# Patient Record
Sex: Female | Born: 1970 | Race: Black or African American | Hispanic: No | Marital: Married
Health system: Northeastern US, Academic
[De-identification: ages and names within clinical notes are randomized; demographics above are authoritative.]

## PROBLEM LIST (undated history)

## (undated) DIAGNOSIS — E119 Type 2 diabetes mellitus without complications: Secondary | ICD-10-CM

## (undated) HISTORY — PX: NO PAST SURGERIES: SHX2092

---

## 2003-01-07 ENCOUNTER — Ambulatory Visit (HOSPITAL_COMMUNITY): Admission: RE | Admit: 2003-01-07 | Discharge: 2003-01-07 | Payer: Self-pay | Admitting: *Deleted

## 2003-04-10 ENCOUNTER — Ambulatory Visit (HOSPITAL_COMMUNITY): Admission: RE | Admit: 2003-04-10 | Discharge: 2003-04-10 | Payer: Self-pay | Admitting: *Deleted

## 2003-04-15 ENCOUNTER — Encounter: Admission: RE | Admit: 2003-04-15 | Discharge: 2003-04-15 | Payer: Self-pay | Admitting: *Deleted

## 2003-04-22 ENCOUNTER — Encounter: Admission: RE | Admit: 2003-04-22 | Discharge: 2003-04-22 | Payer: Self-pay | Admitting: *Deleted

## 2003-04-29 ENCOUNTER — Encounter: Admission: RE | Admit: 2003-04-29 | Discharge: 2003-04-29 | Payer: Self-pay | Admitting: *Deleted

## 2003-05-13 ENCOUNTER — Encounter: Admission: RE | Admit: 2003-05-13 | Discharge: 2003-05-13 | Payer: Self-pay | Admitting: *Deleted

## 2003-05-20 ENCOUNTER — Encounter: Admission: RE | Admit: 2003-05-20 | Discharge: 2003-05-20 | Payer: Self-pay | Admitting: *Deleted

## 2003-05-27 ENCOUNTER — Encounter: Admission: RE | Admit: 2003-05-27 | Discharge: 2003-05-27 | Payer: Self-pay | Admitting: *Deleted

## 2003-06-03 ENCOUNTER — Encounter: Admission: RE | Admit: 2003-06-03 | Discharge: 2003-06-03 | Payer: Self-pay | Admitting: Family Medicine

## 2003-06-07 ENCOUNTER — Inpatient Hospital Stay (HOSPITAL_COMMUNITY): Admission: AD | Admit: 2003-06-07 | Discharge: 2003-06-10 | Payer: Self-pay | Admitting: *Deleted

## 2003-10-12 ENCOUNTER — Encounter: Admission: RE | Admit: 2003-10-12 | Discharge: 2004-01-10 | Payer: Self-pay | Admitting: *Deleted

## 2003-11-10 ENCOUNTER — Emergency Department (HOSPITAL_COMMUNITY): Admission: AD | Admit: 2003-11-10 | Discharge: 2003-11-10 | Payer: Self-pay | Admitting: Family Medicine

## 2007-04-22 ENCOUNTER — Encounter: Payer: Self-pay | Admitting: Obstetrics & Gynecology

## 2007-04-22 ENCOUNTER — Inpatient Hospital Stay (HOSPITAL_COMMUNITY): Admission: AD | Admit: 2007-04-22 | Discharge: 2007-04-24 | Payer: Self-pay | Admitting: Obstetrics & Gynecology

## 2008-08-03 ENCOUNTER — Encounter: Admission: RE | Admit: 2008-08-03 | Discharge: 2008-08-03 | Payer: Self-pay | Admitting: Internal Medicine

## 2009-08-10 ENCOUNTER — Ambulatory Visit (HOSPITAL_COMMUNITY): Admission: RE | Admit: 2009-08-10 | Discharge: 2009-08-10 | Payer: Self-pay | Admitting: Obstetrics

## 2009-09-15 ENCOUNTER — Emergency Department (HOSPITAL_COMMUNITY): Admission: EM | Admit: 2009-09-15 | Discharge: 2009-09-15 | Payer: Self-pay | Admitting: Emergency Medicine

## 2009-09-19 ENCOUNTER — Inpatient Hospital Stay (HOSPITAL_COMMUNITY): Admission: AD | Admit: 2009-09-19 | Discharge: 2009-09-19 | Payer: Self-pay | Admitting: Obstetrics

## 2009-09-19 ENCOUNTER — Emergency Department (HOSPITAL_COMMUNITY): Admission: EM | Admit: 2009-09-19 | Discharge: 2009-09-19 | Payer: Self-pay | Admitting: Emergency Medicine

## 2009-11-27 ENCOUNTER — Inpatient Hospital Stay (HOSPITAL_COMMUNITY): Admission: AD | Admit: 2009-11-27 | Discharge: 2009-11-27 | Payer: Self-pay | Admitting: Obstetrics

## 2009-12-26 ENCOUNTER — Inpatient Hospital Stay (HOSPITAL_COMMUNITY): Admission: AD | Admit: 2009-12-26 | Discharge: 2009-12-26 | Payer: Self-pay | Admitting: Obstetrics & Gynecology

## 2010-02-17 ENCOUNTER — Inpatient Hospital Stay (HOSPITAL_COMMUNITY): Admission: AD | Admit: 2010-02-17 | Discharge: 2010-02-17 | Payer: Self-pay | Admitting: Obstetrics

## 2010-02-25 ENCOUNTER — Ambulatory Visit (HOSPITAL_COMMUNITY): Admission: RE | Admit: 2010-02-25 | Discharge: 2010-02-25 | Payer: Self-pay | Admitting: Obstetrics

## 2010-07-20 ENCOUNTER — Emergency Department (HOSPITAL_COMMUNITY): Admission: EM | Admit: 2010-07-20 | Discharge: 2010-07-20 | Payer: Self-pay | Admitting: Emergency Medicine

## 2010-10-12 ENCOUNTER — Inpatient Hospital Stay (HOSPITAL_COMMUNITY)
Admission: AD | Admit: 2010-10-12 | Discharge: 2010-10-12 | Payer: Self-pay | Source: Home / Self Care | Admitting: Gynecology

## 2010-11-05 ENCOUNTER — Emergency Department (HOSPITAL_COMMUNITY)
Admission: EM | Admit: 2010-11-05 | Discharge: 2010-11-05 | Payer: Self-pay | Source: Home / Self Care | Admitting: Family Medicine

## 2010-11-17 ENCOUNTER — Encounter
Admission: RE | Admit: 2010-11-17 | Discharge: 2010-11-17 | Payer: Self-pay | Source: Home / Self Care | Attending: Family Medicine | Admitting: Family Medicine

## 2010-11-20 ENCOUNTER — Encounter: Admission: RE | Admit: 2010-11-20 | Payer: Self-pay | Source: Home / Self Care | Admitting: Family Medicine

## 2010-11-30 ENCOUNTER — Encounter: Admit: 2010-11-30 | Payer: Self-pay | Admitting: Family Medicine

## 2010-12-11 ENCOUNTER — Encounter: Payer: Self-pay | Admitting: Obstetrics

## 2010-12-12 ENCOUNTER — Encounter: Payer: Self-pay | Admitting: Internal Medicine

## 2011-01-02 ENCOUNTER — Inpatient Hospital Stay (INDEPENDENT_AMBULATORY_CARE_PROVIDER_SITE_OTHER)
Admission: RE | Admit: 2011-01-02 | Discharge: 2011-01-02 | Disposition: A | Discharge status: Home / Self Care | Payer: Self-pay | Source: Ambulatory Visit | Attending: Emergency Medicine | Admitting: Emergency Medicine

## 2011-01-02 DIAGNOSIS — K2970 Gastritis, unspecified, without bleeding: Secondary | ICD-10-CM

## 2011-01-02 DIAGNOSIS — K2990 Gastroduodenitis, unspecified, without bleeding: Secondary | ICD-10-CM

## 2011-01-02 LAB — CBC
MCV: 87.8 fL (ref 78.0–100.0)
RBC: 3.85 MIL/uL — ABNORMAL LOW (ref 3.87–5.11)
WBC: 5.4 10*3/uL (ref 4.0–10.5)

## 2011-01-02 LAB — COMPREHENSIVE METABOLIC PANEL
Alkaline Phosphatase: 38 U/L — ABNORMAL LOW (ref 39–117)
BUN: 4 mg/dL — ABNORMAL LOW (ref 6–23)
Chloride: 105 mEq/L (ref 96–112)
Creatinine, Ser: 0.41 mg/dL (ref 0.4–1.2)
Glucose, Bld: 141 mg/dL — ABNORMAL HIGH (ref 70–99)
Potassium: 3.5 mEq/L (ref 3.5–5.1)

## 2011-01-02 LAB — DIFFERENTIAL
Basophils Relative: 0 % (ref 0–1)
Eosinophils Absolute: 0.1 10*3/uL (ref 0.0–0.7)
Monocytes Absolute: 0.2 10*3/uL (ref 0.1–1.0)
Monocytes Relative: 4 % (ref 3–12)

## 2011-01-02 LAB — LIPASE, BLOOD: Lipase: 19 U/L (ref 11–59)

## 2011-01-02 LAB — POCT H PYLORI SCREEN: H. PYLORI SCREEN, POC: NEGATIVE

## 2011-01-31 LAB — URINALYSIS, ROUTINE W REFLEX MICROSCOPIC
Glucose, UA: NEGATIVE mg/dL
Ketones, ur: NEGATIVE mg/dL
Specific Gravity, Urine: >1.03 — ABNORMAL HIGH (ref 1.005–1.030)
Urobilinogen, UA: 0.2 mg/dL (ref 0.0–1.0)
pH: 5.5 (ref 5.0–8.0)

## 2011-01-31 LAB — ABO/RH: ABO/RH(D): A POS

## 2011-01-31 LAB — URINE MICROSCOPIC-ADD ON

## 2011-02-03 LAB — CBC
Hemoglobin: 11.5 g/dL — ABNORMAL LOW (ref 12.0–15.0)
MCHC: 34.3 g/dL (ref 30.0–36.0)
RBC: 3.76 MIL/uL — ABNORMAL LOW (ref 3.87–5.11)
RDW: 13.2 % (ref 11.5–15.5)
WBC: 4.7 10*3/uL (ref 4.0–10.5)

## 2011-02-03 LAB — DIFFERENTIAL
Basophils Absolute: 0 10*3/uL (ref 0.0–0.1)
Basophils Relative: 0 % (ref 0–1)
Monocytes Absolute: 0.4 10*3/uL (ref 0.1–1.0)
Neutro Abs: 1.7 10*3/uL (ref 1.7–7.7)
Neutrophils Relative %: 35 % — ABNORMAL LOW (ref 43–77)

## 2011-02-03 LAB — POCT URINALYSIS DIPSTICK: Nitrite: NEGATIVE

## 2011-02-03 LAB — POCT I-STAT, CHEM 8
Glucose, Bld: 106 mg/dL — ABNORMAL HIGH (ref 70–99)
HCT: 38 % (ref 36.0–46.0)
Hemoglobin: 12.9 g/dL (ref 12.0–15.0)
Potassium: 3.7 mEq/L (ref 3.5–5.1)
Sodium: 141 mEq/L (ref 135–145)

## 2011-02-03 LAB — POCT H PYLORI SCREEN: H. PYLORI SCREEN, POC: NEGATIVE

## 2011-02-05 LAB — CBC
HCT: 35 % — ABNORMAL LOW (ref 36.0–46.0)
Hemoglobin: 11.7 g/dL — ABNORMAL LOW (ref 12.0–15.0)
MCHC: 33.5 g/dL (ref 30.0–36.0)
RDW: 14.4 % (ref 11.5–15.5)

## 2011-02-05 LAB — GC/CHLAMYDIA PROBE AMP, GENITAL: Chlamydia, DNA Probe: NEGATIVE

## 2011-02-05 LAB — WET PREP, GENITAL: Clue Cells Wet Prep HPF POC: NONE SEEN

## 2011-02-12 LAB — POCT PREGNANCY, URINE: Preg Test, Ur: NEGATIVE

## 2011-02-12 LAB — URINALYSIS, ROUTINE W REFLEX MICROSCOPIC
Bilirubin Urine: NEGATIVE
Glucose, UA: NEGATIVE mg/dL
Ketones, ur: NEGATIVE mg/dL
pH: 6 (ref 5.0–8.0)

## 2011-02-12 LAB — URINE MICROSCOPIC-ADD ON

## 2011-02-12 LAB — WET PREP, GENITAL: Yeast Wet Prep HPF POC: NONE SEEN

## 2011-02-12 LAB — GC/CHLAMYDIA PROBE AMP, GENITAL
Chlamydia, DNA Probe: NEGATIVE
GC Probe Amp, Genital: NEGATIVE

## 2011-02-23 LAB — CBC
HCT: 30.8 % — ABNORMAL LOW (ref 36.0–46.0)
HCT: 33.2 % — ABNORMAL LOW (ref 36.0–46.0)
Hemoglobin: 11.1 g/dL — ABNORMAL LOW (ref 12.0–15.0)
MCV: 91.7 fL (ref 78.0–100.0)
MCV: 90.8 fL (ref 78.0–100.0)
MCV: 91.5 fL (ref 78.0–100.0)
Platelets: 214 10*3/uL (ref 150–400)
Platelets: 233 10*3/uL (ref 150–400)
RBC: 3.65 MIL/uL — ABNORMAL LOW (ref 3.87–5.11)
RDW: 14.4 % (ref 11.5–15.5)
RDW: 14.5 % (ref 11.5–15.5)
WBC: 4.2 10*3/uL (ref 4.0–10.5)
WBC: 8.8 10*3/uL (ref 4.0–10.5)

## 2011-02-23 LAB — POCT PREGNANCY, URINE: Preg Test, Ur: NEGATIVE

## 2011-02-23 LAB — POCT URINALYSIS DIP (DEVICE)
Glucose, UA: NEGATIVE mg/dL
Nitrite: NEGATIVE
Specific Gravity, Urine: 1.015 (ref 1.005–1.030)
Urobilinogen, UA: 0.2 mg/dL (ref 0.0–1.0)
pH: 5 (ref 5.0–8.0)

## 2011-02-23 LAB — URINALYSIS, ROUTINE W REFLEX MICROSCOPIC
Glucose, UA: NEGATIVE mg/dL
Hgb urine dipstick: NEGATIVE
Specific Gravity, Urine: 1.034 — ABNORMAL HIGH (ref 1.005–1.030)
pH: 6 (ref 5.0–8.0)

## 2011-02-23 LAB — DIFFERENTIAL
Basophils Absolute: 0 10*3/uL (ref 0.0–0.1)
Basophils Absolute: 0 10*3/uL (ref 0.0–0.1)
Eosinophils Absolute: 0.3 10*3/uL (ref 0.0–0.7)
Eosinophils Absolute: 0 10*3/uL (ref 0.0–0.7)
Eosinophils Relative: 10 % — ABNORMAL HIGH (ref 0–5)
Eosinophils Relative: 0 % (ref 0–5)
Eosinophils Relative: 5 % (ref 0–5)
Lymphocytes Relative: 10 % — ABNORMAL LOW (ref 12–46)
Lymphocytes Relative: 41 % (ref 12–46)
Lymphs Abs: 0.8 10*3/uL (ref 0.7–4.0)
Monocytes Absolute: 0.1 10*3/uL (ref 0.1–1.0)
Monocytes Relative: 1 % — ABNORMAL LOW (ref 3–12)
Monocytes Relative: 3 % (ref 3–12)

## 2011-02-23 LAB — COMPREHENSIVE METABOLIC PANEL
AST: 21 U/L (ref 0–37)
Albumin: 3.8 g/dL (ref 3.5–5.2)
Chloride: 105 mEq/L (ref 96–112)
Creatinine, Ser: 0.58 mg/dL (ref 0.4–1.2)
GFR calc Af Amer: >60 mL/min (ref >60)
Potassium: 3.7 mEq/L (ref 3.5–5.1)
Total Bilirubin: 0.6 mg/dL (ref 0.3–1.2)
Total Protein: 7.9 g/dL (ref 6.0–8.3)

## 2011-03-22 ENCOUNTER — Encounter: Payer: Medicaid Other | Attending: Obstetrics and Gynecology

## 2011-03-22 DIAGNOSIS — Z713 Dietary counseling and surveillance: Secondary | ICD-10-CM | POA: Insufficient documentation

## 2011-03-22 DIAGNOSIS — O99810 Abnormal glucose complicating pregnancy: Secondary | ICD-10-CM | POA: Insufficient documentation

## 2011-04-04 NOTE — H&P (Signed)
Kristen Ford, Kristen Ford               ACCOUNT NO.:  [id number]   MEDICAL RECORD NO.:  [id number]          PATIENT TYPE:  INP   LOCATION:  9163                          FACILITY:  [facility]   PHYSICIAN:  Roseanna Rainbow, M.D.DATE OF BIRTH:  [birth year]   DATE OF ADMISSION:  04/22/2007  DATE OF DISCHARGE:                              HISTORY & PHYSICAL   CHIEF COMPLAINT:  The patient is a 40 year old para 1 with an estimated  date of confinement of April 25, 2007, with an intrauterine pregnancy at  39+ weeks complaining of uterine contractions.   HISTORY OF PRESENT ILLNESS:  Please see the above.   ALLERGIES:  No known drug allergies.   MEDICATIONS:  Please see the medication reconciliation form.   OBSTETRICAL RISK FACTORS:  1. Gestational diabetes, diet controlled.  2. Advanced maternal age.   PRENATAL SCREENINGS:  Hemoglobin 10.9; hematocrit 33.1; platelets  242,000.  RPR nonreactive.  Hemoglobin electrophoresis normal.  Rubella  immune.  Blood type is A positive, antibody screen negative.  Pap smear  negative.  GBS positive.  Chlamydia and gonorrhea DNA probes negative.  One-hour GCT 178.   PAST GYNECOLOGICAL HISTORY:  Noncontributory.   PAST MEDICAL HISTORY:  No significant history of medical diseases.   PAST SURGICAL HISTORY:  She denies.   SOCIAL HISTORY:  Unemployed, married.  Does not give any significant  history of alcohol usage.  Has no significant smoking history.  Denies  illicit drug use.   FAMILY HISTORY:  Adult-onset diabetes.   PAST OBSTETRICAL HISTORY:  In July 2004 she was delivered of a live-born  female, full-term, 7 pounds, vaginal delivery, complicated by  gestational diabetes.   PHYSICAL EXAMINATION:  Vital signs stable, afebrile.  Fetal heart  tracing reassuring.  Tocodynamometer:  Uterine contractions every 5-10  minutes.  Sterile vaginal exam per the RN:  The cervix is 5 cm dilated.   ASSESSMENT:  1. Intrauterine pregnancy at term.  2.  Gestational diabetes, diet controlled.  3. Late latent versus early active labor.  4. Fetal heart tracing consistent with fetal well-being.  5. Group B streptococcus positive.   PLAN:  1. Admission.  2. Check random CBG now.  3. Augmentation of labor with low-dose Pitocin per protocol.  4. GBS prophylaxis with penicillin.      Roseanna Rainbow, M.D.  Electronically Signed     LAJ/MEDQ  D:  04/22/2007  T:  04/22/2007  Job:  [id number]

## 2011-04-07 NOTE — Op Note (Signed)
   NAME:  IISHA, SOYARS                         ACCOUNT NO.:  [id number]   MEDICAL RECORD NO.:  [id number]                   PATIENT TYPE:  INP   LOCATION:  9108                                 FACILITY:  [facility]   PHYSICIAN:  Phil D. Okey Dupre, M.D.                  DATE OF BIRTH:  04-04-1971   DATE OF PROCEDURE:  06/08/2003  DATE OF DISCHARGE:                                 OPERATIVE REPORT   PROCEDURE:  Vacuum-assisted vaginal delivery with second degree perineal  laceration.   PREOPERATIVE DIAGNOSIS:  1. Term pregnancy with extended second stage of labor.  2. Maternal exhaustion.   POSTOPERATIVE DIAGNOSES:  1. Term pregnancy with extended second stage of labor.  2. Maternal exhaustion.   OPERATOR:  Phil D. Okey Dupre, M.D.   ASSISTANT:  Lawerance Sabal, M.D.   ANESTHESIA:  Epidural.   ESTIMATED BLOOD LOSS:  500 mL.   OPERATIVE FINDINGS:  After an extended second stage of labor, the baby was  mainly in an ROP presentation, the patient finally did convert to ROA  presentation at a +3 station and was completely exhausted, _______.  The  vacuum cup was placed over the baby's occiput and with the patient pushing,  the baby was easily delivered with two contractions.  It was a female with  an Apgar of 8 and 9.  The cord was doubly clamped, divided, the baby taken  to the isolette and suctioned.  The placenta was spontaneously removed,  examined and complete.  There was a second degree perineal laceration, which  was repaired with a 3-0 Vicryl suture.  Postpartum condition   Dictation ended at this point.                                               Phil D. Okey Dupre, M.D.    PDR/MEDQ  D:  06/09/2003  T:  06/09/2003  Job:  [id number]

## 2011-05-10 ENCOUNTER — Inpatient Hospital Stay (HOSPITAL_COMMUNITY)
Admission: AD | Admit: 2011-05-10 | Discharge: 2011-05-13 | DRG: 775 | Disposition: A | Discharge status: Home / Self Care | Payer: Medicaid Other | Source: Ambulatory Visit | Attending: Obstetrics and Gynecology | Admitting: Obstetrics and Gynecology

## 2011-05-10 DIAGNOSIS — O99814 Abnormal glucose complicating childbirth: Principal | ICD-10-CM | POA: Diagnosis present

## 2011-05-10 DIAGNOSIS — O09529 Supervision of elderly multigravida, unspecified trimester: Secondary | ICD-10-CM | POA: Diagnosis present

## 2011-05-10 LAB — CBC
HCT: 33.7 % — ABNORMAL LOW (ref 36.0–46.0)
MCH: 30.7 pg (ref 26.0–34.0)
MCV: 86.2 fL (ref 78.0–100.0)
Platelets: 142 10*3/uL — ABNORMAL LOW (ref 150–400)
RBC: 3.91 MIL/uL (ref 3.87–5.11)
RDW: 13.3 % (ref 11.5–15.5)
WBC: 4.3 10*3/uL (ref 4.0–10.5)

## 2011-05-11 ENCOUNTER — Other Ambulatory Visit: Payer: Self-pay | Admitting: Obstetrics and Gynecology

## 2011-05-11 LAB — GLUCOSE, CAPILLARY: Glucose-Capillary: 121 mg/dL — ABNORMAL HIGH (ref 70–99)

## 2011-05-12 LAB — CBC
Hemoglobin: 10.3 g/dL — ABNORMAL LOW (ref 12.0–15.0)
MCH: 30.2 pg (ref 26.0–34.0)
MCHC: 33.9 g/dL (ref 30.0–36.0)
Platelets: 128 10*3/uL — ABNORMAL LOW (ref 150–400)
RBC: 3.41 MIL/uL — ABNORMAL LOW (ref 3.87–5.11)

## 2011-05-12 LAB — STREP B DNA PROBE

## 2011-05-12 LAB — GLUCOSE, CAPILLARY: Glucose-Capillary: 145 mg/dL — ABNORMAL HIGH (ref 70–99)

## 2011-06-06 ENCOUNTER — Inpatient Hospital Stay (HOSPITAL_COMMUNITY): Admission: AD | Admit: 2011-06-06 | Payer: Self-pay | Source: Home / Self Care | Admitting: Obstetrics and Gynecology

## 2011-08-25 ENCOUNTER — Emergency Department (HOSPITAL_COMMUNITY)
Admission: EM | Admit: 2011-08-25 | Discharge: 2011-08-25 | Disposition: A | Discharge status: Home / Self Care | Payer: No Typology Code available for payment source | Attending: Emergency Medicine | Admitting: Emergency Medicine

## 2011-08-25 ENCOUNTER — Emergency Department (HOSPITAL_COMMUNITY): Payer: No Typology Code available for payment source

## 2011-08-25 DIAGNOSIS — T1490XA Injury, unspecified, initial encounter: Secondary | ICD-10-CM | POA: Insufficient documentation

## 2011-08-25 DIAGNOSIS — R279 Unspecified lack of coordination: Secondary | ICD-10-CM | POA: Insufficient documentation

## 2011-08-25 DIAGNOSIS — Y92410 Unspecified street and highway as the place of occurrence of the external cause: Secondary | ICD-10-CM | POA: Insufficient documentation

## 2011-08-25 DIAGNOSIS — R51 Headache: Secondary | ICD-10-CM | POA: Insufficient documentation

## 2011-08-25 DIAGNOSIS — S060X0A Concussion without loss of consciousness, initial encounter: Secondary | ICD-10-CM | POA: Insufficient documentation

## 2011-08-27 ENCOUNTER — Inpatient Hospital Stay (INDEPENDENT_AMBULATORY_CARE_PROVIDER_SITE_OTHER)
Admission: RE | Admit: 2011-08-27 | Discharge: 2011-08-27 | Disposition: A | Discharge status: Home / Self Care | Payer: No Typology Code available for payment source | Source: Ambulatory Visit | Attending: Family Medicine | Admitting: Family Medicine

## 2011-08-27 ENCOUNTER — Ambulatory Visit (INDEPENDENT_AMBULATORY_CARE_PROVIDER_SITE_OTHER): Payer: No Typology Code available for payment source

## 2011-08-27 DIAGNOSIS — S20229A Contusion of unspecified back wall of thorax, initial encounter: Secondary | ICD-10-CM

## 2011-09-07 LAB — CBC
HCT: 28.8 — ABNORMAL LOW
HCT: 30.8 — ABNORMAL LOW
Hemoglobin: 10.5 — ABNORMAL LOW
MCHC: 33.4
MCHC: 34.1
MCV: 84.4
MCV: 86.2
Platelets: 172
Platelets: 177
RDW: 14.5 — ABNORMAL HIGH
RDW: 15.2 — ABNORMAL HIGH

## 2012-02-12 ENCOUNTER — Other Ambulatory Visit: Payer: Self-pay | Admitting: Family Medicine

## 2012-02-12 DIAGNOSIS — Z1231 Encounter for screening mammogram for malignant neoplasm of breast: Secondary | ICD-10-CM

## 2012-02-23 ENCOUNTER — Ambulatory Visit: Payer: Medicaid Other

## 2012-03-27 IMAGING — CR DG LUMBAR SPINE COMPLETE 4+V
5 series · 5 of 5 positions shown · non-contrast
Comparison: 07/20/2010, 09/15/2009

CLINICAL DATA: Trauma, back pain

LUMBAR SPINE - COMPLETE 4+ VIEW

[view not recorded (1 of 5)]
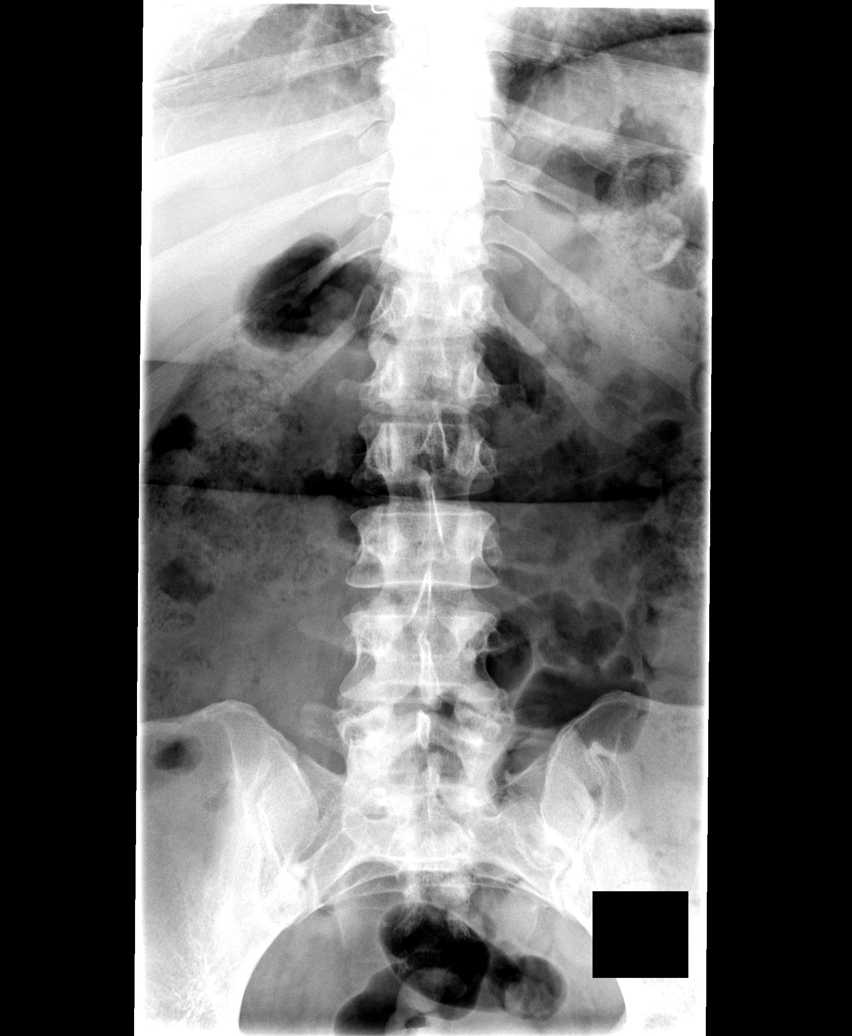

[view not recorded (2 of 5)]
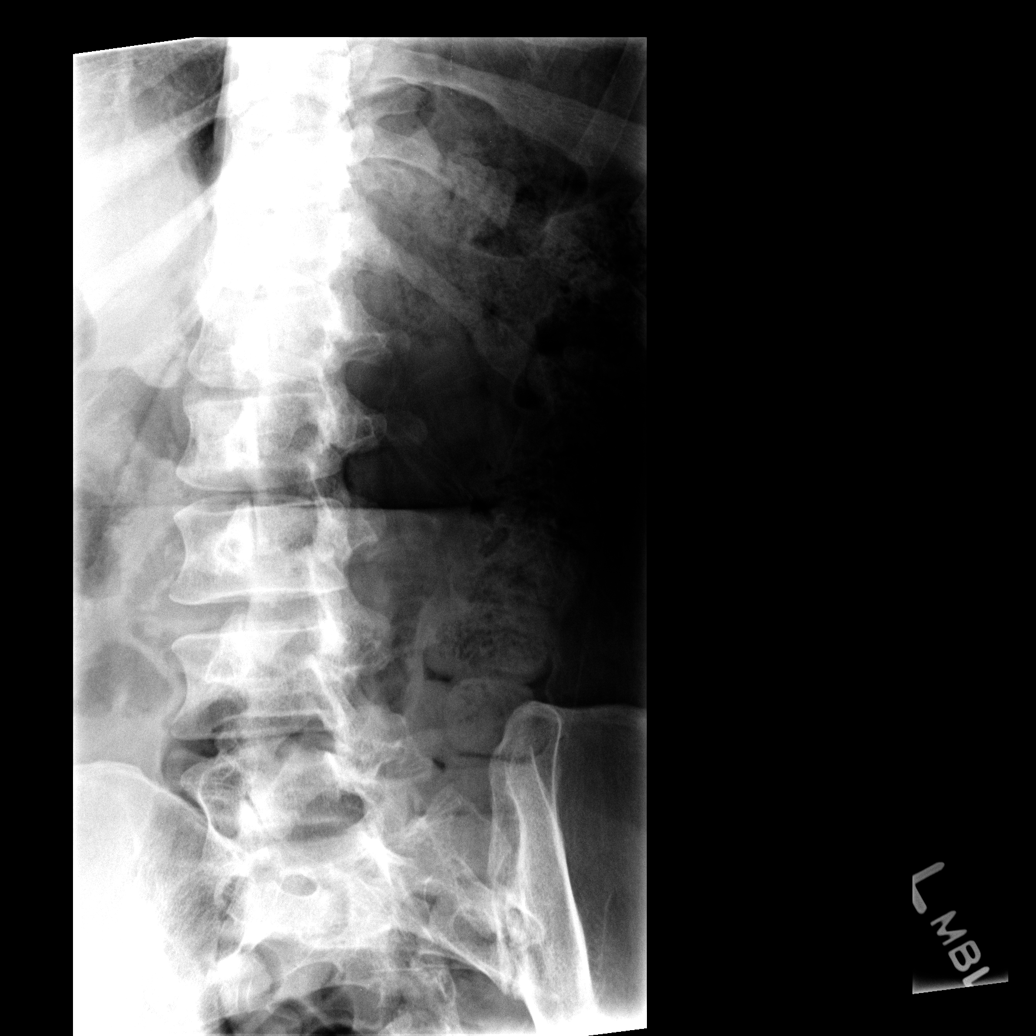

[view not recorded (3 of 5)]
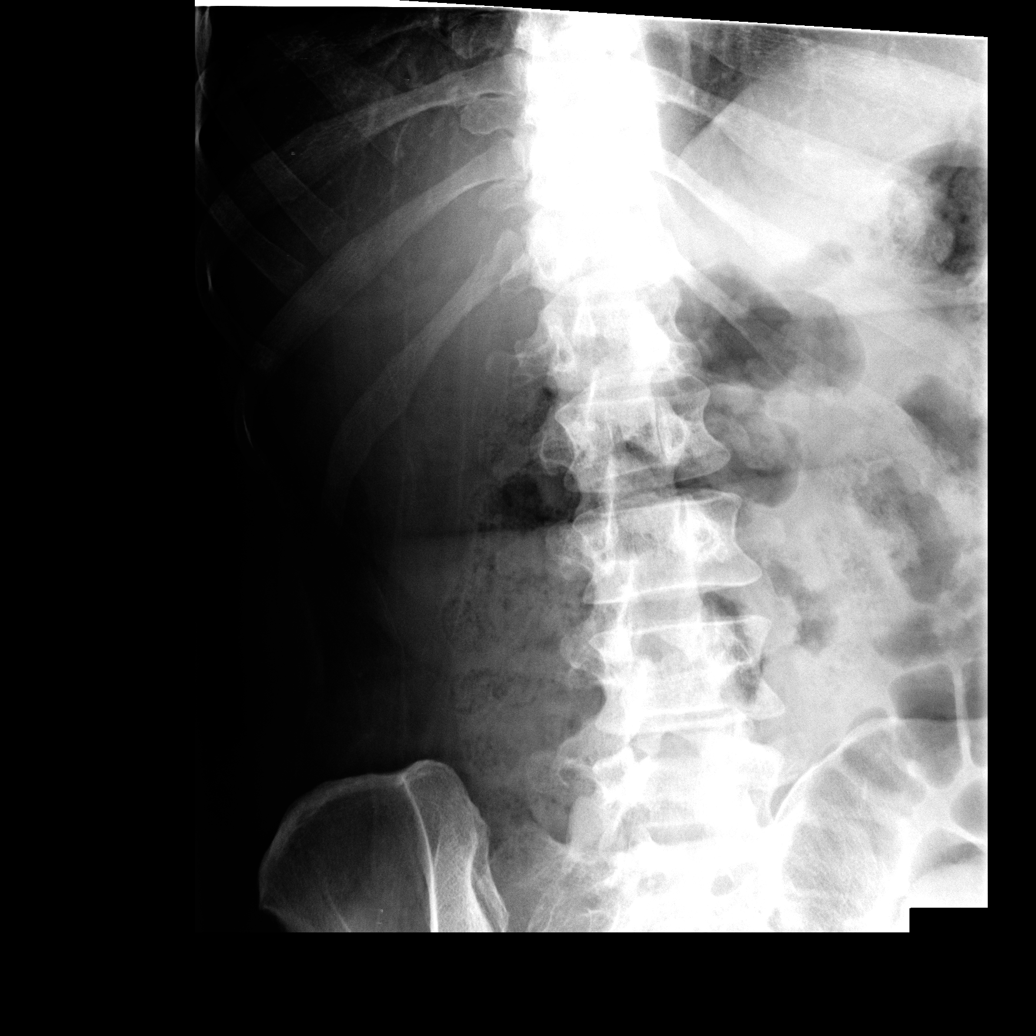

[view not recorded (4 of 5)]
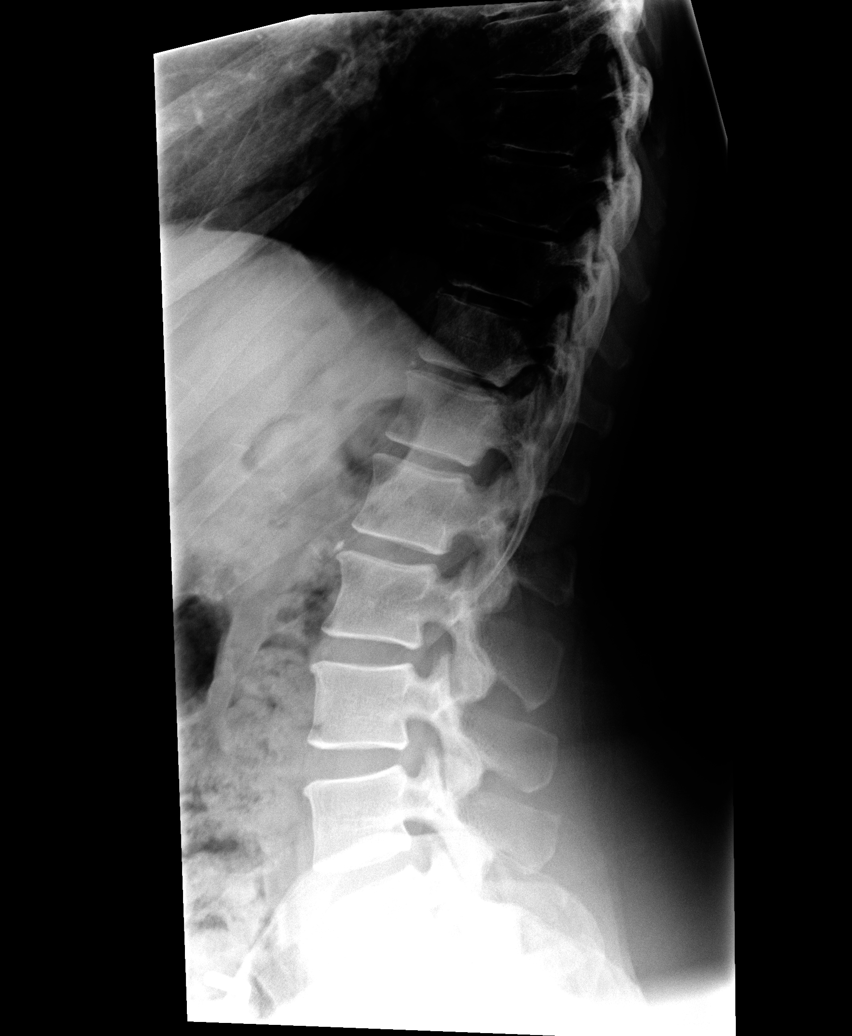

[view not recorded (5 of 5)]
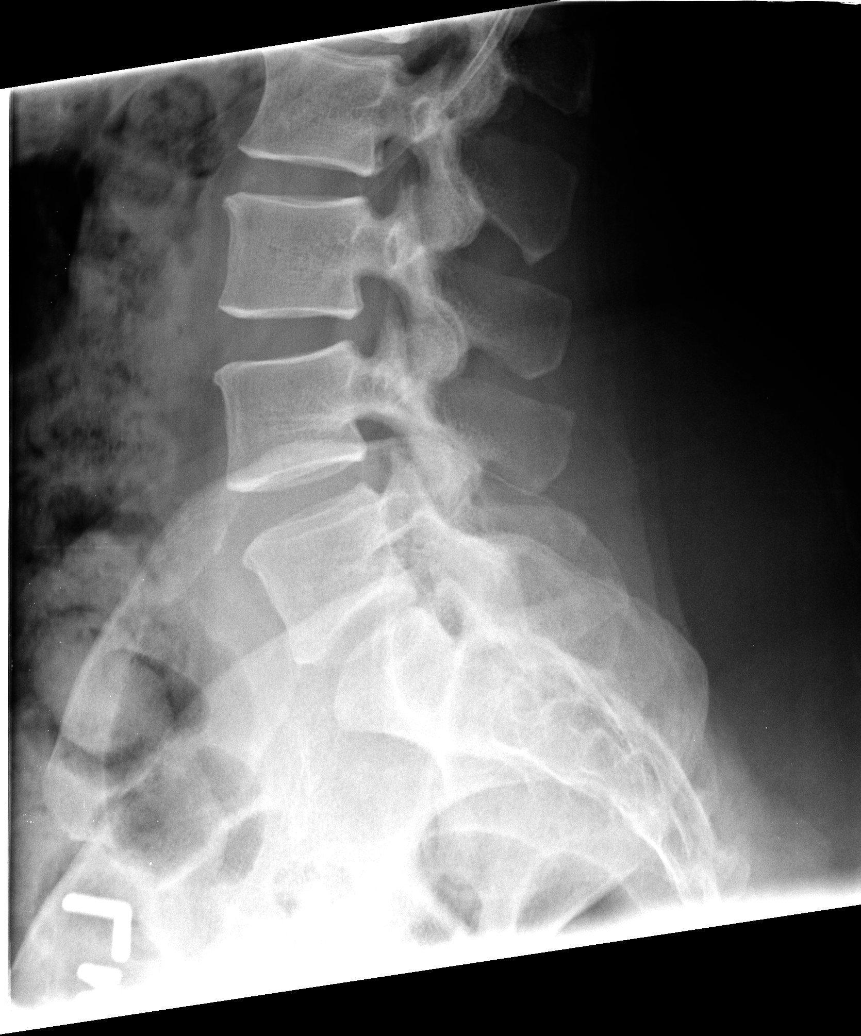

[5 of 5 positions shown; findings below may reference images not displayed]

FINDINGS: Normal alignment.  No compression fracture, wedge shaped
deformity or focal kyphosis.  Preserved vertebral body heights and
disc spaces.  No pars defects.  Normal pedicles and SI joints.
IMPRESSION: No acute osseous finding.  Stable exam.

## 2012-03-28 ENCOUNTER — Other Ambulatory Visit: Payer: Self-pay | Admitting: Obstetrics

## 2012-03-28 DIAGNOSIS — Z1231 Encounter for screening mammogram for malignant neoplasm of breast: Secondary | ICD-10-CM

## 2012-04-19 ENCOUNTER — Ambulatory Visit
Admission: RE | Admit: 2012-04-19 | Discharge: 2012-04-19 | Disposition: A | Discharge status: Home / Self Care | Payer: Medicaid Other | Source: Ambulatory Visit | Attending: Obstetrics | Admitting: Obstetrics

## 2012-04-19 DIAGNOSIS — Z1231 Encounter for screening mammogram for malignant neoplasm of breast: Secondary | ICD-10-CM

## 2012-11-10 ENCOUNTER — Encounter (HOSPITAL_COMMUNITY): Payer: Self-pay

## 2012-11-10 ENCOUNTER — Emergency Department (HOSPITAL_COMMUNITY)
Admission: EM | Admit: 2012-11-10 | Discharge: 2012-11-10 | Disposition: A | Discharge status: Home / Self Care | Payer: Medicaid Other | Source: Home / Self Care

## 2012-11-10 DIAGNOSIS — L089 Local infection of the skin and subcutaneous tissue, unspecified: Secondary | ICD-10-CM

## 2012-11-10 MED ORDER — CLINDAMYCIN HCL 300 MG PO CAPS: ORAL_CAPSULE | ORAL | Status: DC

## 2012-11-10 NOTE — ED Provider Notes (Signed)
Medical screening examination/treatment/procedure(s) were performed by non-physician practitioner and as supervising physician I was immediately available for consultation/collaboration.  Sahana Boyland, M.D.   Jhoanna Heyde C Joh Rao, MD 11/10/12 2029 

## 2012-11-10 NOTE — ED Provider Notes (Signed)
History     CSN: [id number]  Arrival date & time 11/10/12  1452   None     Chief Complaint  Patient presents with  . Otalgia    (Consider location/radiation/quality/duration/timing/severity/associated sxs/prior treatment) HPI Comments: 41 year old female presents with a small tender pustule of the left pain. It is located approximately 4 mm from the tragus. It is at the entrance of the [facility]. It is approximately 4 mm across. It is red and tender. There is no redness extends beyond the lesion and there is no erythema or involvement of the other aspects of the pena   History reviewed. No pertinent past medical history.  History reviewed. No pertinent past surgical history.  History reviewed. No pertinent family history.  History  Substance Use Topics  . Smoking status: Not on file  . Smokeless tobacco: Not on file  . Alcohol Use: Not on file    OB History    Grav Para Term Preterm Abortions TAB SAB Ect Mult Living                  Review of Systems  All other systems reviewed and are negative.    Allergies  Review of patient's allergies indicates no known allergies.  Home Medications   Current Outpatient Rx  Name  Route  Sig  Dispense  Refill  . CLINDAMYCIN HCL 300 MG PO CAPS      1 bid for 8 days   16 capsule   0     BP 114/71  Pulse 81  Temp 98.3 F (36.8 C) (Oral)  Resp 16  SpO2 98%  LMP 11/08/2012  Physical Exam  Constitutional: She is oriented to person, place, and time. She appears well-developed and well-nourished. No distress.  HENT:       Bilateral TMs are normal. there is some wax securing part of visualization of the TMs  Neck: Normal range of motion. Neck supple.  Pulmonary/Chest: Effort normal.  Lymphadenopathy:    She has no cervical adenopathy.  Neurological: She is alert and oriented to person, place, and time.  Skin: Skin is warm and dry.  Psychiatric: She has a normal mood and affect.    ED Course  INCISION AND  DRAINAGE Performed by: Phineas Real, Burlon Centrella Authorized by: Phineas Real, Radwan Cowley Consent: Verbal consent obtained. Risks and benefits: risks, benefits and alternatives were discussed Consent given by: patient Patient identity confirmed: verbally with patient Type: abscess Body area: head/neck Location details: left external ear Anesthesia: local infiltration Local anesthetic: lidocaine 2% without epinephrine Patient sedated: no Scalpel size: 11 Incision type: single straight Complexity: simple Drainage: purulent and bloody Drainage amount: scant Wound treatment: wound left open Comments: .And also was placed at the external auditory canal.   (including critical care time)  Labs Reviewed - No data to display No results found.   1. Pustule       MDM  To procedure above. Using an 11 blade approximately 2 mm puncture wound was made into the small pustule. There was slight purulence that drained. A culture was obtained. Is to apply warm compresses as often as possible for the next couple days. Clindamycin 300 twice a day for 8 days Return for any new symptoms problems or worsening         Hayden Rasmussen, NP 11/10/12 1702

## 2012-11-10 NOTE — ED Notes (Signed)
C/o pain in her left ear for 1 week; ?lesion noted external canal?

## 2012-11-13 LAB — CULTURE, ROUTINE-ABSCESS: Gram Stain: NONE SEEN

## 2013-03-24 ENCOUNTER — Other Ambulatory Visit: Payer: Self-pay | Admitting: Family Medicine

## 2013-03-24 DIAGNOSIS — Z1231 Encounter for screening mammogram for malignant neoplasm of breast: Secondary | ICD-10-CM

## 2013-04-25 ENCOUNTER — Ambulatory Visit
Admission: RE | Admit: 2013-04-25 | Discharge: 2013-04-25 | Disposition: A | Discharge status: Home / Self Care | Payer: Medicaid Other | Source: Ambulatory Visit | Attending: Family Medicine | Admitting: Family Medicine

## 2013-04-25 DIAGNOSIS — Z1231 Encounter for screening mammogram for malignant neoplasm of breast: Secondary | ICD-10-CM

## 2014-03-04 ENCOUNTER — Inpatient Hospital Stay (HOSPITAL_COMMUNITY)
Admission: AD | Admit: 2014-03-04 | Discharge: 2014-03-04 | Disposition: A | Discharge status: Home / Self Care | Payer: Medicaid Other | Source: Ambulatory Visit | Attending: Obstetrics & Gynecology | Admitting: Obstetrics & Gynecology

## 2014-03-04 ENCOUNTER — Encounter (HOSPITAL_COMMUNITY): Payer: Self-pay | Admitting: *Deleted

## 2014-03-04 DIAGNOSIS — R109 Unspecified abdominal pain: Secondary | ICD-10-CM | POA: Insufficient documentation

## 2014-03-04 DIAGNOSIS — E119 Type 2 diabetes mellitus without complications: Secondary | ICD-10-CM | POA: Insufficient documentation

## 2014-03-04 DIAGNOSIS — M549 Dorsalgia, unspecified: Secondary | ICD-10-CM | POA: Diagnosis present

## 2014-03-04 DIAGNOSIS — Z833 Family history of diabetes mellitus: Secondary | ICD-10-CM | POA: Insufficient documentation

## 2014-03-04 DIAGNOSIS — M545 Low back pain, unspecified: Secondary | ICD-10-CM | POA: Insufficient documentation

## 2014-03-04 HISTORY — DX: Type 2 diabetes mellitus without complications: E11.9

## 2014-03-04 LAB — URINALYSIS, ROUTINE W REFLEX MICROSCOPIC
Bilirubin Urine: NEGATIVE
Glucose, UA: NEGATIVE mg/dL
KETONES UR: NEGATIVE mg/dL
NITRITE: NEGATIVE
PROTEIN: NEGATIVE mg/dL
Specific Gravity, Urine: >1.03 — ABNORMAL HIGH (ref 1.005–1.030)
Urobilinogen, UA: 0.2 mg/dL (ref 0.0–1.0)
pH: 6 (ref 5.0–8.0)

## 2014-03-04 LAB — URINE MICROSCOPIC-ADD ON

## 2014-03-04 LAB — WET PREP, GENITAL
CLUE CELLS WET PREP: NONE SEEN
TRICH WET PREP: NONE SEEN
YEAST WET PREP: NONE SEEN

## 2014-03-04 LAB — POCT PREGNANCY, URINE: Preg Test, Ur: NEGATIVE

## 2014-03-04 MED ORDER — KETOROLAC TROMETHAMINE 10 MG PO TABS
10.0000 mg | ORAL_TABLET | Freq: Once | ORAL | Status: AC
  Administered 2014-03-04: 10 mg via ORAL
  Filled 2014-03-04: qty 1

## 2014-03-04 MED ORDER — IBUPROFEN 800 MG PO TABS: 800.0000 mg | ORAL_TABLET | Freq: Three times a day (TID) | ORAL | Status: AC

## 2014-03-04 MED ORDER — CYCLOBENZAPRINE HCL 10 MG PO TABS: 10.0000 mg | ORAL_TABLET | Freq: Three times a day (TID) | ORAL | Status: AC | PRN

## 2014-03-04 NOTE — MAU Note (Signed)
Back pain and pain in lower abd started a few days ago.  Hx of ovarian cyst.  Has an IUD, checked for strings and could not feel them

## 2014-03-04 NOTE — Discharge Instructions (Signed)
Back Exercises °Back exercises help treat and prevent back injuries. The goal of back exercises is to increase the strength of your abdominal and back muscles and the flexibility of your back. These exercises should be started when you no longer have back pain. Back exercises include: °· Pelvic Tilt. Lie on your back with your knees bent. Tilt your pelvis until the lower part of your back is against the floor. Hold this position 5 to 10 sec and repeat 5 to 10 times. °· Knee to Chest. Pull first 1 knee up against your chest and hold for 20 to 30 seconds, repeat this with the other knee, and then both knees. This may be done with the other leg straight or bent, whichever feels better. °· Sit-Ups or Curl-Ups. Bend your knees 90 degrees. Start with tilting your pelvis, and do a partial, slow sit-up, lifting your trunk only 30 to 45 degrees off the floor. Take at least 2 to 3 seconds for each sit-up. Do not do sit-ups with your knees out straight. If partial sit-ups are difficult, simply do the above but with only tightening your abdominal muscles and holding it as directed. °· Hip-Lift. Lie on your back with your knees flexed 90 degrees. Push down with your feet and shoulders as you raise your hips a couple inches off the floor; hold for 10 seconds, repeat 5 to 10 times. °· Back arches. Lie on your stomach, propping yourself up on bent elbows. Slowly press on your hands, causing an arch in your low back. Repeat 3 to 5 times. Any initial stiffness and discomfort should lessen with repetition over time. °· Shoulder-Lifts. Lie face down with arms beside your body. Keep hips and torso pressed to floor as you slowly lift your head and shoulders off the floor. °Do not overdo your exercises, especially in the beginning. Exercises may cause you some mild back discomfort which lasts for a few minutes; however, if the pain is more severe, or lasts for more than 15 minutes, do not continue exercises until you see your caregiver.  Improvement with exercise therapy for back problems is slow.  °See your caregivers for assistance with developing a proper back exercise program. °Document Released: 12/14/2004 Document Revised: 01/29/2012 Document Reviewed: 09/07/2011 °[employer]® Patient Information ©2014 [employer]. ° °Back Pain, Adult °Low back pain is very common. About 1 in 5 people have back pain. The cause of low back pain is rarely dangerous. The pain often gets better over time. About half of people with a sudden onset of back pain feel better in just 2 weeks. About 8 in 10 people feel better by 6 weeks.  °CAUSES °Some common causes of back pain include: °· Strain of the muscles or ligaments supporting the spine. °· Wear and tear (degeneration) of the spinal discs. °· Arthritis. °· Direct injury to the back. °DIAGNOSIS °Most of the time, the direct cause of low back pain is not known. However, back pain can be treated effectively even when the exact cause of the pain is unknown. Answering your caregiver's questions about your overall health and symptoms is one of the most accurate ways to make sure the cause of your pain is not dangerous. If your caregiver needs more information, he or she may order lab work or imaging tests (X-rays or MRIs). However, even if imaging tests show changes in your back, this usually does not require surgery. °HOME CARE INSTRUCTIONS °For many people, back pain returns. Since low back pain is rarely dangerous, it is often a condition that people   can learn to manage on their own.  °· Remain active. It is stressful on the back to sit or stand in one place. Do not sit, drive, or stand in one place for more than 30 minutes at a time. Take short walks on level surfaces as soon as pain allows. Try to increase the length of time you walk each day. °· Do not stay in bed. Resting more than 1 or 2 days can delay your recovery. °· Do not avoid exercise or work. Your body is made to move. It is not dangerous to be active,  even though your back may hurt. Your back will likely heal faster if you return to being active before your pain is gone. °· Pay attention to your body when you  bend and lift. Many people have less discomfort when lifting if they bend their knees, keep the load close to their bodies, and avoid twisting. Often, the most comfortable positions are those that put less stress on your recovering back. °· Find a comfortable position to sleep. Use a firm mattress and lie on your side with your knees slightly bent. If you lie on your back, put a pillow under your knees. °· Only take over-the-counter or prescription medicines as directed by your caregiver. Over-the-counter medicines to reduce pain and inflammation are often the most helpful. Your caregiver may prescribe muscle relaxant drugs. These medicines help dull your pain so you can more quickly return to your normal activities and healthy exercise. °· Put ice on the injured area. °· Put ice in a plastic bag. °· Place a towel between your skin and the bag. °· Leave the ice on for 15-20 minutes, 03-04 times a day for the first 2 to 3 days. After that, ice and heat may be alternated to reduce pain and spasms. °· Ask your caregiver about trying back exercises and gentle massage. This may be of some benefit. °· Avoid feeling anxious or stressed. Stress increases muscle tension and can worsen back pain. It is important to recognize when you are anxious or stressed and learn ways to manage it. Exercise is a great option. °SEEK MEDICAL CARE IF: °· You have pain that is not relieved with rest or medicine. °· You have pain that does not improve in 1 week. °· You have new symptoms. °· You are generally not feeling well. °SEEK IMMEDIATE MEDICAL CARE IF:  °· You have pain that radiates from your back into your legs. °· You develop new bowel or bladder control problems. °· You have unusual weakness or numbness in your arms or legs. °· You develop nausea or vomiting. °· You develop  abdominal pain. °· You feel faint. °Document Released: 11/06/2005 Document Revised: 05/07/2012 Document Reviewed: 03/27/2011 °[employer]® Patient Information ©2014 [employer]. ° °

## 2014-03-04 NOTE — MAU Provider Note (Signed)
History     CSN: [id number]  Arrival date and time: 03/04/14 1104   First Provider Initiated Contact with Patient 03/04/14 1343      Chief Complaint  Patient presents with  . Back Pain  . Abdominal Pain   HPI Comments: Kristen Ford [age] y.o. [phone] presents to MAU with low back pains that have been ongoing for 4 days. The pain is a '6" on 1-10 scale. She took 600 mg Motrin last night with a little relief. She denies any UTI symptoms. She cant feel her IUD strings and thinks " maybe it moved".  She did not call Dr Clearance CootsHarper.      Past Medical History  Diagnosis Date  . Diabetes mellitus without complication     Past Surgical History  Procedure Laterality Date  . No past surgeries      Family History  Problem Relation Age of Onset  . Diabetes Mother     History  Substance Use Topics  . Smoking status: Never Smoker   . Smokeless tobacco: Not on file  . Alcohol Use: No    Allergies: No Known Allergies  Prescriptions prior to admission  Medication Sig Dispense Refill  . metFORMIN (GLUCOPHAGE) 500 MG tablet Take by mouth 2 (two) times daily with a meal.        Review of Systems  Constitutional: Negative.   HENT: Negative.   Eyes: Negative.   Respiratory: Negative.   Cardiovascular: Negative.   Gastrointestinal: Negative.   Genitourinary: Negative.   Musculoskeletal: Positive for back pain.  Skin: Negative.   Neurological: Negative.   Psychiatric/Behavioral: Negative.    Physical Exam   Blood pressure 104/72, pulse 61, temperature 98.5 F (36.9 C), temperature source Oral, resp. rate 18, height 5\' 3"  (1.6 m), weight 78.472 kg (173 lb).  Physical Exam  Constitutional: She appears well-developed and well-nourished. No distress.  HENT:  Head: Normocephalic and atraumatic.  Cardiovascular: Normal rate, regular rhythm and normal heart sounds.   Respiratory: Effort normal and breath sounds normal. No respiratory distress. She has no wheezes. She has no rales.  She exhibits no tenderness.  GI: Soft. Bowel sounds are normal. She exhibits no distension and no mass. There is no tenderness. There is no rebound and no guarding.  Genitourinary:  Genital:External negative Vaginal:thin, white discharge Cervix: IUD strings present, no CMT Bimanual: Nontender uterus and adnexa   Musculoskeletal: Normal range of motion. She exhibits tenderness.       Arms: Muscle pain on left paraspinal  Regions with palpation   Results for orders placed during the hospital encounter of 03/04/14 (from the past 24 hour(s))  URINALYSIS, ROUTINE W REFLEX MICROSCOPIC     Status: Abnormal   Collection Time    03/04/14 11:25 AM      Result Value Ref Range   Color, Urine YELLOW  YELLOW   APPearance CLEAR  CLEAR   Specific Gravity, Urine >1.030 (*) 1.005 - 1.030   pH 6.0  5.0 - 8.0   Glucose, UA NEGATIVE  NEGATIVE mg/dL   Hgb urine dipstick MODERATE (*) NEGATIVE   Bilirubin Urine NEGATIVE  NEGATIVE   Ketones, ur NEGATIVE  NEGATIVE mg/dL   Protein, ur NEGATIVE  NEGATIVE mg/dL   Urobilinogen, UA 0.2  0.0 - 1.0 mg/dL   Nitrite NEGATIVE  NEGATIVE   Leukocytes, UA TRACE (*) NEGATIVE  URINE MICROSCOPIC-ADD ON     Status: Abnormal   Collection Time    03/04/14 11:25 AM  Result Value Ref Range   Squamous Epithelial / LPF RARE  RARE   WBC, UA 3-6  <3 WBC/hpf   RBC / HPF 3-6  <3 RBC/hpf   Bacteria, UA FEW (*) RARE  POCT PREGNANCY, URINE     Status: None   Collection Time    03/04/14 11:32 AM      Result Value Ref Range   Preg Test, Ur NEGATIVE  NEGATIVE    MAU Course  Procedures  MDM  Toradol 10 mg PO/ declines IM Urine culture  Assessment and Plan   A: Back Pain/ seems like muscle pain  P: Toradol 10 mg now/ had to leave to pick up children before evaluation of medication could be made Motrin 800 mg TID PRN Flexeril 10 mg TID PRN Note to excuse from work till Monday Rest/ Heat Follow up with Dr Clearance Cootsharper as needed   Kristen Ford 03/04/2014, 2:16 PM

## 2014-03-04 NOTE — MAU Note (Signed)
C/o not feeling her IUD strings; c/o back, pain more on the lower L side, for past 4 days;

## 2014-03-05 LAB — GC/CHLAMYDIA PROBE AMP
CT PROBE, AMP APTIMA: NEGATIVE
GC PROBE AMP APTIMA: NEGATIVE

## 2014-05-21 ENCOUNTER — Other Ambulatory Visit: Payer: Self-pay

## 2014-05-21 DIAGNOSIS — Z1231 Encounter for screening mammogram for malignant neoplasm of breast: Secondary | ICD-10-CM

## 2014-05-25 ENCOUNTER — Ambulatory Visit
Admission: RE | Admit: 2014-05-25 | Discharge: 2014-05-25 | Disposition: A | Discharge status: Home / Self Care | Payer: Medicaid Other | Source: Ambulatory Visit

## 2014-05-25 DIAGNOSIS — Z1231 Encounter for screening mammogram for malignant neoplasm of breast: Secondary | ICD-10-CM

## 2014-09-21 ENCOUNTER — Encounter (HOSPITAL_COMMUNITY): Payer: Self-pay | Admitting: *Deleted

## 2019-12-23 MED ORDER — ERGOCALCIFEROL (VITAMIN D2) 1,250 MCG (50,000 UNIT) CAPSULE
1250 | ORAL_CAPSULE | ORAL | 4 refills | 84.00000 days | Status: AC
Start: 2019-12-23 — End: 2021-02-10

## 2019-12-23 MED ORDER — METFORMIN 1,000 MG TABLET
1000 | ORAL_TABLET | Freq: Two times a day (BID) | ORAL | 4 refills | 90.00000 days | Status: AC
Start: 2019-12-23 — End: 2021-02-10

## 2020-02-29 ENCOUNTER — Ambulatory Visit: Admit: 2020-02-29 | Payer: PRIVATE HEALTH INSURANCE | Primary: Internal Medicine

## 2020-02-29 DIAGNOSIS — Z23 Encounter for immunization: Secondary | ICD-10-CM

## 2020-03-07 ENCOUNTER — Ambulatory Visit: Admit: 2020-03-07 | Payer: PRIVATE HEALTH INSURANCE | Primary: Internal Medicine

## 2020-03-21 ENCOUNTER — Ambulatory Visit: Admit: 2020-03-21 | Payer: PRIVATE HEALTH INSURANCE | Primary: Internal Medicine

## 2020-03-21 DIAGNOSIS — Z23 Encounter for immunization: Secondary | ICD-10-CM

## 2020-04-02 ENCOUNTER — Inpatient Hospital Stay: Admit: 2020-04-02 | Discharge: 2020-04-02 | Payer: PRIVATE HEALTH INSURANCE | Primary: Internal Medicine

## 2020-04-02 DIAGNOSIS — E559 Vitamin D deficiency, unspecified: Secondary | ICD-10-CM

## 2020-04-02 DIAGNOSIS — Z13220 Encounter for screening for lipoid disorders: Secondary | ICD-10-CM

## 2020-04-02 DIAGNOSIS — Z1231 Encounter for screening mammogram for malignant neoplasm of breast: Secondary | ICD-10-CM

## 2020-04-02 DIAGNOSIS — E119 Type 2 diabetes mellitus without complications: Secondary | ICD-10-CM

## 2020-04-02 DIAGNOSIS — Z0001 Encounter for general adult medical examination with abnormal findings: Secondary | ICD-10-CM

## 2020-04-02 LAB — COMPREHENSIVE METABOLIC PANEL
BKR ALANINE AMINOTRANSFERASE (ALT): 17 U/L (ref 13–56)
BKR ALBUMIN: 4 g/dL (ref 3.4–5.0)
BKR ALKALINE PHOSPHATASE: 50 U/L (ref 45–117)
BKR ANION GAP (LM): 6 mmol/L (ref 5–15)
BKR ASPARTATE AMINOTRANSFERASE (AST): 11 U/L — ABNORMAL LOW (ref 15–37)
BKR BILIRUBIN TOTAL: 0.4 mg/dL (ref <1.0)
BKR BLOOD UREA NITROGEN: 10 mg/dL (ref 7–18)
BKR CALCIUM: 8.8 mg/dL (ref 8.5–10.1)
BKR CHLORIDE: 105 mmol/L (ref 98–107)
BKR CO2: 26 mmol/L (ref 21–32)
BKR CREATININE: 0.55 mg/dL (ref 0.55–1.02)
BKR EGFR (AFR AMER) (LMC): >60 mL/min/{1.73_m2} (ref >60)
BKR EGFR (NON AFR AMER) (LMC): >60 mL/min/{1.73_m2} (ref >60)
BKR GLOBULIN: 3.7 g/dL (ref 2.5–5.0)
BKR GLUCOSE: 152 mg/dL — ABNORMAL HIGH (ref 65–110)
BKR POTASSIUM: 3.6 mmol/L (ref 3.5–5.1)
BKR PROTEIN TOTAL: 7.7 g/dL (ref 6.4–8.2)
BKR SODIUM: 137 mmol/L (ref 136–145)

## 2020-04-02 LAB — URINALYSIS WITH CULTURE REFLEX      (BH LMW YH)
BKR BILIRUBIN, UA MG/DL: NEGATIVE mg/dL
BKR GLUCOSE, UA MG/DL: NEGATIVE mg/dL
BKR KETONES, UA MG/DL: NEGATIVE mg/dL
BKR LEUKOCYTE ESTERASE, UA: NEGATIVE Leu/uL
BKR NITRITE, UA: NEGATIVE
BKR PH, UA: 5 (ref 5.0–7.0)
BKR PROTEIN, UA.: NEGATIVE mg/dL
BKR RBC/HPF INSTRUMENT: 3 /HPF (ref 0–3)
BKR SPECIFIC GRAVITY, UA: 1.023 (ref 1.003–1.035)
BKR URINE SQUAMOUS EPITHELIAL CELLS, UA (NUMERIC): 14 /HPF — ABNORMAL HIGH (ref 0–5)
BKR UROBILINOGEN, UA - ALPHA: NEGATIVE EU/dL
BKR WBC/HPF INSTRUMENT: 3 /HPF (ref 0–5)

## 2020-04-02 LAB — CBC WITH AUTO DIFFERENTIAL
BKR WAM ABSOLUTE IMMATURE GRANULOCYTES.: 0.01 x 1000/ÂµL (ref 0.00–0.10)
BKR WAM ABSOLUTE LYMPHOCYTE COUNT.: 1.52 x 1000/ÂµL (ref 1.00–4.50)
BKR WAM ABSOLUTE NEUTROPHIL COUNT.: 1.58 x 1000/ÂµL (ref 1.40–6.60)
BKR WAM BASOPHIL ABSOLUTE COUNT.: 0.01 x 1000/ÂµL (ref 0.0–0.3)
BKR WAM BASOPHILS: 0.3 % (ref 0.0–3.0)
BKR WAM EOSINOPHIL ABSOLUTE COUNT.: 0.09 x 1000/ÂµL (ref 0.00–0.45)
BKR WAM EOSINOPHILS: 2.6 % (ref 0.0–4.0)
BKR WAM HEMATOCRIT: 34.5 % (ref 34.0–45.0)
BKR WAM HEMOGLOBIN: 11.5 g/dL (ref 11.0–15.0)
BKR WAM IMMATURE GRANULOCYTES: 0.3 % (ref 0.0–1.0)
BKR WAM LYMPHOCYTES: 43.2 % (ref 25.0–45.0)
BKR WAM MCH PG: 30.8 pg (ref 27–33)
BKR WAM MCHC: 33.3 g/dL (ref 32.0–36.0)
BKR WAM MCV: 92.5 fL (ref 79.0–99.0)
BKR WAM MONOCYTE ABSOLUTE COUNT.: 0.31 x 1000/ÂµL (ref 0.00–1.20)
BKR WAM MONOCYTES: 8.8 % (ref 0.0–12.0)
BKR WAM MPV: 11.3 fL (ref 7.5–11.5)
BKR WAM NEUTROPHILS: 44.8 % (ref 36.0–66.0)
BKR WAM NUCLEATED RED BLOOD CELLS: 0 % (ref 0.0–5.0)
BKR WAM PLATELETS: 224 x1000/ÂµL (ref 140–400)
BKR WAM RDW-CV: 13 % (ref 11.5–14.5)
BKR WAM RED BLOOD CELL COUNT.: 3.73 M/ÂµL — ABNORMAL LOW (ref 4.00–5.20)
BKR WAM WHITE BLOOD CELL COUNT.: 3.52 x1000/ÂµL — ABNORMAL LOW (ref 4.00–10.00)

## 2020-04-02 LAB — LIPID PANEL
BKR CHOLESTEROL: 149 mg/dL
BKR HDL CHOLESTEROL: 65 mg/dL — ABNORMAL HIGH
BKR LDL CHOLESTEROL CALCULATED: 75 mg/dL
BKR TRIGLYCERIDES: 47 mg/dL

## 2020-04-02 LAB — UA REFLEX CULTURE

## 2020-04-02 LAB — ALBUMIN/CREATININE PANEL, URINE, RANDOM
BKR ALBUMIN, URINE, RANDOM: 21.4 mg/L (ref <30.0)
BKR CREATININE, URINE, RANDOM: 117 mg/dL
BKR MICROALBUMIN/CREATININE RATIO, URINE, RANDOM (LM): 18.3 mg/g{creat} (ref 0.0–29.9)

## 2020-04-02 LAB — HEMOGLOBIN A1C: BKR HEMOGLOBIN A1C: 7.3 % — ABNORMAL HIGH (ref 4.2–6.3)

## 2020-04-02 LAB — HEPATITIS C AB WITH REFLEX TO HCV PCR: BKR HEPATITIS C ANTIBODY (LM): NEGATIVE

## 2020-04-02 LAB — TSH W/REFLEX TO FT4     (BH GH LMW Q YH): BKR THYROID STIMULATING HORMONE (LM): 0.79 u[IU]/mL (ref 0.36–3.74)

## 2020-04-05 LAB — QUESTASSURED 25-OH VITAMIN D, (D2,D3), LC/MS/MS    (Q)
VITAMIN D, 25-OH D2: 31 ng/mL
VITAMIN D, 25-OH D3: 6 ng/mL
VITAMIN D, 25-OH TOTAL: 37 ng/mL (ref 30–100)

## 2020-04-09 NOTE — Other
Spoke with Patient, Notified & Aware of Results

## 2020-05-22 ENCOUNTER — Ambulatory Visit: Admit: 2020-05-22 | Payer: PRIVATE HEALTH INSURANCE | Primary: Internal Medicine

## 2020-07-13 MED ORDER — BLOOD SUGAR DIAGNOSTIC STRIPS
4 refills | 50.00000 days | Status: AC
Start: 2020-07-13 — End: 2021-06-01

## 2020-11-03 ENCOUNTER — Encounter: Admit: 2020-11-03 | Payer: PRIVATE HEALTH INSURANCE | Primary: Internal Medicine

## 2020-11-03 ENCOUNTER — Inpatient Hospital Stay: Admit: 2020-11-03 | Discharge: 2020-11-03 | Payer: PRIVATE HEALTH INSURANCE | Primary: Internal Medicine

## 2020-11-03 DIAGNOSIS — J302 Other seasonal allergic rhinitis: Secondary | ICD-10-CM

## 2020-11-03 DIAGNOSIS — Z1231 Encounter for screening mammogram for malignant neoplasm of breast: Secondary | ICD-10-CM

## 2020-11-03 DIAGNOSIS — E119 Type 2 diabetes mellitus without complications: Secondary | ICD-10-CM

## 2020-11-05 NOTE — Other
Notified. 

## 2021-02-10 MED ORDER — METFORMIN 1,000 MG TABLET
1000 | ORAL_TABLET | ORAL | 4 refills | 90.00000 days | Status: AC
Start: 2021-02-10 — End: 2021-10-24

## 2021-02-10 MED ORDER — ERGOCALCIFEROL (VITAMIN D2) 1,250 MCG (50,000 UNIT) CAPSULE
1250 | ORAL_CAPSULE | ORAL | 4 refills | 84.00000 days | Status: AC
Start: 2021-02-10 — End: 2022-02-07

## 2021-06-01 MED ORDER — BLOOD SUGAR DIAGNOSTIC STRIPS
ORAL_STRIP | 4 refills | 50.00000 days | Status: AC
Start: 2021-06-01 — End: 2022-07-17

## 2021-08-27 ENCOUNTER — Inpatient Hospital Stay: Admit: 2021-08-27 | Discharge: 2021-08-27 | Payer: PRIVATE HEALTH INSURANCE | Primary: Internal Medicine

## 2021-08-27 DIAGNOSIS — Z1231 Encounter for screening mammogram for malignant neoplasm of breast: Secondary | ICD-10-CM

## 2021-08-27 DIAGNOSIS — Z0001 Encounter for general adult medical examination with abnormal findings: Secondary | ICD-10-CM

## 2021-08-27 DIAGNOSIS — Z13220 Encounter for screening for lipoid disorders: Secondary | ICD-10-CM

## 2021-08-27 DIAGNOSIS — E119 Type 2 diabetes mellitus without complications: Secondary | ICD-10-CM

## 2021-08-27 DIAGNOSIS — Z8616 History of COVID-19: Secondary | ICD-10-CM

## 2021-08-27 DIAGNOSIS — E559 Vitamin D deficiency, unspecified: Secondary | ICD-10-CM

## 2021-08-27 DIAGNOSIS — Z1211 Encounter for screening for malignant neoplasm of colon: Secondary | ICD-10-CM

## 2021-08-27 LAB — CBC WITH AUTO DIFFERENTIAL
BKR WAM ABSOLUTE IMMATURE GRANULOCYTES.: 0.01 x 1000/??L (ref 0.00–0.30)
BKR WAM ABSOLUTE LYMPHOCYTE COUNT.: 1.64 x 1000/??L (ref 0.60–3.70)
BKR WAM ABSOLUTE NEUTROPHIL COUNT.: 0.97 x 1000/??L — ABNORMAL LOW (ref 2.00–7.60)
BKR WAM BASOPHIL ABSOLUTE COUNT.: 0.01 x 1000/??L (ref 0.00–1.00)
BKR WAM BASOPHILS: 0.3 % (ref 0.0–1.4)
BKR WAM EOSINOPHIL ABSOLUTE COUNT.: 0.16 x 1000/??L (ref 0.00–1.00)
BKR WAM HEMATOCRIT (2 DEC): 34.6 % — ABNORMAL LOW (ref 35.00–45.00)
BKR WAM HEMOGLOBIN: 11.7 g/dL (ref 11.7–15.5)
BKR WAM IMMATURE GRANULOCYTES: 0.3 % (ref 0.0–1.0)
BKR WAM LYMPHOCYTES: 53.1 % — ABNORMAL HIGH (ref 17.0–50.0)
BKR WAM MCH PG: 31.2 pg (ref 27.0–33.0)
BKR WAM MCHC: 33.8 g/dL (ref 31.0–36.0)
BKR WAM MCV: 92.3 fL (ref 80.0–100.0)
BKR WAM MONOCYTE ABSOLUTE COUNT.: 0.3 x 1000/ÂµL (ref 0.00–1.00)
BKR WAM MONOCYTES: 9.7 % (ref 4.0–12.0)
BKR WAM MPV: 11.6 fL (ref 8.0–12.0)
BKR WAM NEUTROPHILS: 31.4 % — ABNORMAL LOW (ref 39.0–72.0)
BKR WAM NUCLEATED RED BLOOD CELLS: 0 % (ref 0.0–1.0)
BKR WAM PLATELETS: 222 x1000/??L (ref 150–420)
BKR WAM RDW-CV: 13.1 % (ref 11.0–15.0)
BKR WAM RED BLOOD CELL COUNT.: 3.75 M/ÂµL — ABNORMAL LOW (ref 4.00–6.00)
BKR WAM WHITE BLOOD CELL COUNT: 3.1 x1000/ÂµL — ABNORMAL LOW (ref 4.0–11.0)

## 2021-08-27 LAB — COMPREHENSIVE METABOLIC PANEL
BKR ALANINE AMINOTRANSFERASE (ALT): 18 U/L (ref 13–56)
BKR ALBUMIN: 4 g/dL (ref 3.4–5.0)
BKR ALKALINE PHOSPHATASE: 48 U/L — ABNORMAL HIGH (ref 45–117)
BKR ANION GAP (LM): 4 mmol/L — ABNORMAL LOW (ref 5–15)
BKR ASPARTATE AMINOTRANSFERASE (AST): 14 U/L — ABNORMAL LOW (ref 15–37)
BKR BILIRUBIN TOTAL: 0.5 mg/dL (ref <1.0)
BKR BLOOD UREA NITROGEN: 12 mg/dL (ref 7–18)
BKR CALCIUM: 8.8 mg/dL (ref 8.5–10.1)
BKR CHLORIDE: 106 mmol/L — ABNORMAL LOW (ref 98–107)
BKR CO2: 29 mmol/L — ABNORMAL LOW (ref 21–32)
BKR CREATININE: 0.56 mg/dL (ref 0.55–1.02)
BKR EGFR, CREATININE (CKD-EPI 2021): >60 mL/min/{1.73_m2} (ref >=60)
BKR GLOBULIN: 3.3 g/dL (ref 2.5–5.0)
BKR GLUCOSE: 164 mg/dL — ABNORMAL HIGH (ref 65–110)
BKR POTASSIUM: 4.3 mmol/L (ref 3.5–5.1)
BKR PROTEIN TOTAL: 7.3 g/dL (ref 6.4–8.2)
BKR SODIUM: 139 mmol/L (ref 136–145)
BKR WAM EOSINOPHILS: 14 U/L — ABNORMAL LOW (ref 15–37)

## 2021-08-27 LAB — LIPID PANEL
BKR CHOLESTEROL: 137 mg/dL
BKR HDL CHOLESTEROL: 61 mg/dL — ABNORMAL HIGH
BKR LDL CHOLESTEROL SAMPSON CALCULATED: 66 mg/dL
BKR TRIGLYCERIDES: 42 mg/dL (ref 0.36–3.74)

## 2021-08-27 LAB — HEMOGLOBIN A1C: BKR HEMOGLOBIN A1C: 7.5 % — ABNORMAL HIGH (ref 4.2–6.3)

## 2021-08-27 LAB — TSH W/REFLEX TO FT4     (BH GH LMW Q YH): BKR THYROID STIMULATING HORMONE (LM): 0.86 u[IU]/mL (ref 0.36–3.74)

## 2021-08-27 LAB — IRON AND TIBC
BKR IRON: 96 ug/dL (ref 50–170)
BKR TOTAL IRON BINDING CAPACITY (SCCCT  BH GH LM): 267 ug/dL (ref 250–450)
BKR TRANSFERRIN SATURATION: 36 % (ref 27–40)

## 2021-08-27 LAB — FERRITIN: BKR FERRITIN: 83 ng/mL (ref 10–291)

## 2021-08-31 LAB — QUESTASSURED 25-OH VITAMIN D, (D2,D3), LC/MS/MS (BH GH LMW Q)
VITAMIN D, 25-OH D2: 45 ng/mL
VITAMIN D, 25-OH D3: 8 ng/mL
VITAMIN D, 25-OH TOTAL: 53 ng/mL (ref 30–100)

## 2021-10-12 MED ORDER — EMPAGLIFLOZIN 25 MG TABLET
25 | ORAL_TABLET | Freq: Every day | ORAL | 4 refills | 90.00000 days | Status: AC
Start: 2021-10-12 — End: 2021-10-24

## 2021-10-12 NOTE — Other
Left Message for Patient to Return Call  

## 2021-10-24 MED ORDER — METFORMIN 1,000 MG TABLET
1000 | ORAL_TABLET | Freq: Two times a day (BID) | ORAL | 4 refills | 90.00000 days | Status: AC
Start: 2021-10-24 — End: 2023-03-06

## 2021-11-04 ENCOUNTER — Inpatient Hospital Stay: Admit: 2021-11-04 | Discharge: 2021-11-04 | Payer: PRIVATE HEALTH INSURANCE | Primary: Internal Medicine

## 2021-11-04 DIAGNOSIS — Z1231 Encounter for screening mammogram for malignant neoplasm of breast: Secondary | ICD-10-CM

## 2021-11-10 NOTE — Other
Notified. Aware.

## 2021-12-27 ENCOUNTER — Encounter: Admit: 2021-12-27 | Payer: PRIVATE HEALTH INSURANCE | Primary: Internal Medicine

## 2022-01-06 ENCOUNTER — Encounter: Admit: 2022-01-06 | Payer: PRIVATE HEALTH INSURANCE | Primary: Internal Medicine

## 2022-02-07 ENCOUNTER — Encounter: Admit: 2022-02-07 | Payer: PRIVATE HEALTH INSURANCE | Attending: Internal Medicine | Primary: Internal Medicine

## 2022-02-07 MED ORDER — ERGOCALCIFEROL (VITAMIN D2) 1,250 MCG (50,000 UNIT) CAPSULE
1250 | ORAL_CAPSULE | ORAL | 4 refills | 84.00000 days | Status: AC
Start: 2022-02-07 — End: 2023-04-03

## 2022-05-06 ENCOUNTER — Encounter: Admit: 2022-05-06 | Payer: PRIVATE HEALTH INSURANCE | Attending: Internal Medicine | Primary: Internal Medicine

## 2022-05-08 MED ORDER — CONTOUR NEXT EZ METER
12 refills | Status: AC
Start: 2022-05-08 — End: ?

## 2022-05-08 MED ORDER — LANCETS
1 refills | Status: AC
Start: 2022-05-08 — End: ?

## 2022-05-15 ENCOUNTER — Encounter: Admit: 2022-05-15 | Payer: PRIVATE HEALTH INSURANCE | Primary: Internal Medicine

## 2022-07-14 ENCOUNTER — Encounter: Admit: 2022-07-14 | Payer: PRIVATE HEALTH INSURANCE | Attending: Internal Medicine | Primary: Internal Medicine

## 2022-07-17 MED ORDER — CONTOUR NEXT TEST STRIPS
4 refills | Status: AC
Start: 2022-07-17 — End: 2023-07-30

## 2022-07-26 ENCOUNTER — Encounter: Admit: 2022-07-26 | Payer: PRIVATE HEALTH INSURANCE | Attending: Orthopedic Surgery | Primary: Internal Medicine

## 2022-07-26 DIAGNOSIS — M7521 Bicipital tendinitis, right shoulder: Secondary | ICD-10-CM

## 2022-07-31 ENCOUNTER — Inpatient Hospital Stay: Admit: 2022-07-31 | Discharge: 2022-07-31 | Payer: PRIVATE HEALTH INSURANCE | Primary: Internal Medicine

## 2022-07-31 DIAGNOSIS — M545 Low back pain, unspecified: Secondary | ICD-10-CM

## 2022-07-31 DIAGNOSIS — G8929 Other chronic pain: Secondary | ICD-10-CM

## 2022-07-31 NOTE — Other
[facility] [place] [address]OACZY Number: [phone] Number: [phone] Therapy Orthopedic EvaluationPatient Name:  Tina TawfeegMedical Record Number:  [id number] Date of Birth:  May 13, 1972Therapist:  Darnelle Catalan, OTRYour signature indicates that you have reviewed and agree with the established Plan of Care as documented below. This order renewal is required for therapy to continue on an outpatient basis. Please co-sign this document electronically or return via the fax number listed on the document.  Your prompt response is required and appreciated. Additional Physician Comments:   ___________________________________       __________________________________Physician Signature                                             Date___________________________________Physician Printed NameReferring Provider:  Camelia Eng, MDICD-10 Diagnosis(es):Problem List         ICD-10-CM   OT RUE Upper Arm PAin 07/31/2022  Pain of right upper arm M79.621 General InformationTherapy Episode of Care  Date of Visit:   07/31/2022  Treatment Number:   1  Start of Care Date:   07/31/2022  Onset of Illness/Injury Date:   4/15/2023Precautions/Limitations   Precautions/Limitations:  No known Research scientist (physical sciences) Utilized?  NoMedication Review:Current Outpatient Medications Medication Sig ? CONTOUR NEXT USE TO TEST BLOOD SUGAR 6 TIMES PER DAY ? Contour Next EZ Meter USE AS DIRECTED TO CHECK BLOOD SUGARS(PATIENT DUE FOR NEW MONITOR) ? ergocalciferol TAKE 1 CAPSULE BY MOUTH 1 TIME A WEEK ? Fluvirin 2017-2018 Inject 0.5 mLs into the muscle.. ADM 0.5ML IM UTD ? lancets USE AS DIRECTED TO CHECK BLOOD SUGAR 6 TIMES DAILY ? TRUEplus Lancets USE AS DIRECTED ? levonorgestreL Mirena 20 mcg/24 hours (5 yrs) 52 mg intrauterine device Take by intrauterine route for 1825 days. ? metFORMIN Take 1 tablet (1,000 mg total) by mouth 2 (two) times daily with breakfast and dinner. ? 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  Past Medical HistoryPast Medical History: Diagnosis Date ? Diabetes mellitus (HC Code) (HC CODE) (HC Code)  ? Seasonal allergies  Past Surgical HistoryNo past surgical history on file.AllergiesPatient has no known allergies.Pain Rating:  4-8 / 10 Pt has increase RUE upper arm pain with movement and 4/10 at rest   Comments:  Pt is using great to manage her painSocial / Emotional Information:  Pt work as an Airline pilot.  Pt resides in a 2 floor house with 1 STE no railings.  Pt resides with her husband and her kids.  She has 3 kid sl 17, 14, and 10.  Pt enjoys walking and spending time with her family.  Prior Level of Function:  Pt was fully independent at PLOF   Change in Status from prior level of function?  PT is still independent at CLOF but is not doing any heavy sifting and uses her LUE more than before.  ObjectivePt is right hand dominant.  Posture: Pt tested in standingMMT/ROM:Pt AROM in LUE is as followsShoulderFlexion 0-165Abduction 0-165Extension 0-40ER 0-65IR 0-65Pt has 5/5 strengthElbowExtension to flexion 0-135 5/5 strengthRUE AROM is as followsShoulderFlexion 0-70Abduction 0-70Extension 0-15ER 0-10IR 0-10Pt has 3/5 muscle strength as tested  with MMTElbow Extension to flexion  0-135 with 3+/5 strengthPalpation:Pt was point tender RUE deltoid, biceps, and superior aspect of elbowSensation:IntactSpecial TestsQuickDASH - Upper Extremity Function      Open a tight or new jar:  4      Do heavy household chores:  3      Carrying a shopping bag or briefcase:  4      Wash your back:  3      Use a knife to cut food:  2      Activities with force/impact through arm, shoulder or hand:  4      To what extent has problem interfered with normal social activities:  3      Are you limited in work or other daily activities:  3      Severity of arm, shoulder or hand pain:  4      Tingling in arm, shoulder or hand:  1      How much trouble sleeping due to pain in arm, shoulder or hand:  3   Score:  52.27   (The QDASH score ranges from 0-100, with higher scores reflecting increased disability)Treatment Provided This VisitCPT Code Interventions Timed Minutes Untimed Minutes Total Minutes Occupational Therapy Evaluation - Moderate Complexity (97166)   60 60                  Total Treatment Time: 60 Problem ListRUE upper arm pain, RUE elbow pain, RUE muscle weakness, RUE decreased functional useAssessmentPt has BUE AROM that is limited.  LUE is preexisting issue with possible tear of RTC as stated by pt.  Pt RUE AROM and strength affected by pain due to carrying heavy work documents for an extended period of time.  Pt has tired rest but this has not alleviated her pain.  Pt would benefit from OT se rives to return to PLOF.  Patient / Family / Caregiver EducationDiscussed role of therapyDiscussed the value of collaboration with other providersDiscussed the presenting problemReviewed the assessmentDiscussed plan of care and rationalePatient/Family/Caregiver demonstrate agreement with the planWritten materials / instruction providedRehab PotentialGoodPlanTherapeutic Exercise (97110)Manual Therapy (97140)Therapeutic Activity (97530)Self-Care/Home Management (97535)Neuromuscular Re-Education (97112)Community/Work Integration (97537)Ultrasound (97035)Frequency:  2x per weekDuration:  4 weeksPlan for Next VisitUSRecommendationsUse of heat and joint protection techniques GoalsShort Term Goals:1.	Pt will have 2/10 pain with activity in RUE upper arm and elbow within 3 weeks to increase independence with ADL.2.	Pt will have 1/10 pain with activity in RUE upper arm and elbow within 4 weeks to increase independence with ADL.3.	Pt will have AROM in RUE upper arm and elbow WNL within 4 weeks to increase independence with ADL tasks.4.	Pt will have 5/5 muscle strength in RUE upper arm and elbow for all movements within 4 weeks to increase independence with ADL.5.	Pt will be independent with HEP within 2 weeks.Long Term Goals:  (within 4 weeks)1.	 Pt will complete all dressing tasks with no extra time to return to PLOF with self care.2.	Pt will complete all bathing tasks with no extra time to return to PLOF with self care.3.	Pt will complete all light housekeeping tasks with no extra time to return to PLOF with IADL.4.	Pt will complete all community IADL with no extra time to return to PLOF.5.	Pt will participate in all leisure activities at [facility].6.	Pt will sleep through the night and will not wake up in pain for 7/7 days in a week.Pt will drive car with no increase in pain for 7/7 days in a week

## 2022-08-07 ENCOUNTER — Inpatient Hospital Stay: Admit: 2022-08-07 | Discharge: 2022-08-07 | Payer: PRIVATE HEALTH INSURANCE | Primary: Internal Medicine

## 2022-08-07 DIAGNOSIS — G8929 Other chronic pain: Secondary | ICD-10-CM

## 2022-08-07 DIAGNOSIS — M7521 Bicipital tendinitis, right shoulder: Secondary | ICD-10-CM

## 2022-08-07 NOTE — Other
[facility] [place] [address]NWGNF Number: [phone] Number: [phone] Therapy Daily NotePatient Name:  Tina TawfeegMedical Record Number:  [id number] Date of Birth:  Mar 01, 1972Therapist:  Darnelle Catalan, OTRReferring Provider:  Camelia Eng, MDICD-10 Diagnosis(es):Problem List         ICD-10-CM   OT RUE Upper Arm Pain 07/31/2022  Pain of right upper arm M79.621 General InformationTherapy Episode of Care  Date of Visit:   08/07/2022  Treatment Number:   2  Start of Care Date:   07/31/2022  Onset of Illness/Injury Date:   4/15/2023Precautions/Limitations   Precautions/Limitations:  No known Research scientist (physical sciences) Utilized?  NoMedication Review:Current Outpatient Medications Medication Sig ? CONTOUR NEXT USE TO TEST BLOOD SUGAR 6 TIMES PER DAY ? Contour Next EZ Meter USE AS DIRECTED TO CHECK BLOOD SUGARS(PATIENT DUE FOR NEW MONITOR) ? ergocalciferol TAKE 1 CAPSULE BY MOUTH 1 TIME A WEEK ? Fluvirin 2017-2018 Inject 0.5 mLs into the muscle.. ADM 0.5ML IM UTD ? lancets USE AS DIRECTED TO CHECK BLOOD SUGAR 6 TIMES DAILY ? TRUEplus Lancets USE AS DIRECTED ? levonorgestreL Mirena 20 mcg/24 hours (5 yrs) 52 mg intrauterine device Take by intrauterine route for 1825 days. ? metFORMIN Take 1 tablet (1,000 mg total) by mouth 2 (two) times daily with breakfast and dinner. ? olopatadine INSTILL 1 DROP INTO BOTH EYES BID SubjectivePt reports that her RUE arm pain is a 6/10.  ObjectiveTreatment Provided This VisitCPT Code Interventions Timed Minutes Untimed Minutes Total Minutes Therapeutic Activity (97530) STM to LUE shoulder to alleviate muscle tightness, increase AROM, and decrease pain.IASTM to LUE shoulder with use of [place] concave and convex angles with use emollient for skin protctionPt educated on biomechanical makeup of the RUE shoulder.                       Total Treatment Time:  AssessmentPt was more sore today which maybe indicative of the weather.  PlanFrequency:  2x per weekPlan for Next VisitStretches

## 2022-08-08 ENCOUNTER — Encounter: Admit: 2022-08-08 | Payer: Managed Care (Private) | Attending: Internal Medicine | Primary: Internal Medicine

## 2022-08-09 ENCOUNTER — Inpatient Hospital Stay: Admit: 2022-08-09 | Discharge: 2022-08-09 | Payer: PRIVATE HEALTH INSURANCE | Primary: Internal Medicine

## 2022-08-09 DIAGNOSIS — M545 Low back pain, unspecified: Secondary | ICD-10-CM

## 2022-08-09 DIAGNOSIS — M7521 Bicipital tendinitis, right shoulder: Secondary | ICD-10-CM

## 2022-08-09 DIAGNOSIS — M79621 Pain in right upper arm: Secondary | ICD-10-CM

## 2022-08-09 NOTE — Other
Patient no showed for therapy appointment.  Phoned patient and left voice message regarding our no show policy as well as date and time of next scheduled appointment.

## 2022-08-12 ENCOUNTER — Inpatient Hospital Stay: Admit: 2022-08-12 | Discharge: 2022-08-12 | Payer: PRIVATE HEALTH INSURANCE | Primary: Internal Medicine

## 2022-08-12 ENCOUNTER — Encounter: Admit: 2022-08-12 | Payer: PRIVATE HEALTH INSURANCE | Primary: Internal Medicine

## 2022-08-12 ENCOUNTER — Ambulatory Visit: Admit: 2022-08-12 | Payer: Managed Care (Private) | Attending: Emergency Medicine | Primary: Internal Medicine

## 2022-08-12 MED ORDER — CIPROFLOXACIN 500 MG TABLET
500 | ORAL_TABLET | Freq: Two times a day (BID) | ORAL | 1 refills | 7.00000 days | Status: AC
Start: 2022-08-12 — End: ?

## 2022-08-14 ENCOUNTER — Inpatient Hospital Stay: Admit: 2022-08-14 | Discharge: 2022-08-14 | Payer: PRIVATE HEALTH INSURANCE | Primary: Internal Medicine

## 2022-08-14 DIAGNOSIS — G8929 Other chronic pain: Secondary | ICD-10-CM

## 2022-08-14 DIAGNOSIS — M545 Low back pain, unspecified: Secondary | ICD-10-CM

## 2022-08-14 DIAGNOSIS — M7521 Bicipital tendinitis, right shoulder: Secondary | ICD-10-CM

## 2022-08-14 DIAGNOSIS — M79621 Pain in right upper arm: Secondary | ICD-10-CM

## 2022-08-14 LAB — URINE CULTURE

## 2022-08-15 NOTE — Other
[facility] [place] [address]WUJWJ Number: [phone] Number: [phone] Therapy Daily NotePatient Name:  Elaysha TawfeegMedical Record Number:  [id number] Date of Birth:  23-Mar-1972Therapist:  Darnelle Catalan, OTRReferring Provider:  Camelia Eng, MDICD-10 Diagnosis(es):Problem List         ICD-10-CM   OT RUE Upper Arm Pain 07/31/2022  Pain of right upper arm M79.621 General InformationTherapy Episode of Care  Date of Visit:   08/14/2022  Treatment Number:   3  Start of Care Date:   07/31/2022  Onset of Illness/Injury Date:   4/15/2023Precautions/Limitations   Precautions/Limitations:  No known Research scientist (physical sciences) Utilized?  NoMedication Review:Current Outpatient Medications Medication Sig ? CONTOUR NEXT USE TO TEST BLOOD SUGAR 6 TIMES PER DAY ? ciprofloxacin HCl Take 1 tablet (500 mg total) by mouth 2 (two) times daily for 7 days. ? Contour Next EZ Meter USE AS DIRECTED TO CHECK BLOOD SUGARS(PATIENT DUE FOR NEW MONITOR) ? ergocalciferol TAKE 1 CAPSULE BY MOUTH 1 TIME A WEEK ? Fluvirin 2017-2018 Inject 0.5 mLs into the muscle. ADM 0.5ML IM UTD ? lancets USE AS DIRECTED TO CHECK BLOOD SUGAR 6 TIMES DAILY ? TRUEplus Lancets USE AS DIRECTED ? levonorgestreL Mirena 20 mcg/24 hours (5 yrs) 52 mg intrauterine device Take by intrauterine route for 1825 days. ? metFORMIN Take 1 tablet (1,000 mg total) by mouth 2 (two) times daily with breakfast and dinner. ? olopatadine INSTILL 1 DROP INTO BOTH EYES BID SubjectivePt reports that her RUE arm pain is a 2-3/10.  .  ObjectiveTreatment Provided This VisitCPT Code Interventions Timed Minutes Untimed Minutes Total Minutes Therapeutic Activity (97530) STM to LUE shoulder to alleviate muscle tightness, increase AROM, and decrease pain.IASTM to LUE shoulder with use of [place] concave and convex angles with use emollient for skin protctionPt educated on biomechanical makeup of the RUE shoulder.  BUE use of flex bar, ran, for x 10 reps x 2 sets of pronation/supination and wrist flexion/extension  30  30                  Total Treatment Time: 30 AssessmentPt was more sore today which maybe indicative of the weather.  PlanFrequency:  2x per weekPlan for Next VisitStretches

## 2022-08-16 ENCOUNTER — Inpatient Hospital Stay: Admit: 2022-08-16 | Discharge: 2022-08-16 | Payer: PRIVATE HEALTH INSURANCE | Primary: Internal Medicine

## 2022-08-16 DIAGNOSIS — M7521 Bicipital tendinitis, right shoulder: Secondary | ICD-10-CM

## 2022-08-16 DIAGNOSIS — M545 Low back pain, unspecified: Secondary | ICD-10-CM

## 2022-08-16 NOTE — Other
[facility] [place] [address]ONG[place] Number: [phone] Number: [phone] Therapy Daily NotePatient Name:  Tina TawfeegMedical Record Number:  [id number] Date of Birth:  1972/12/04Therapist:  Darnelle Catalan, OTRReferring Provider:  Camelia Eng, MDICD-10 Diagnosis(es):Problem List         ICD-10-CM   OT RUE Upper Arm Pain 07/31/2022  Pain of right upper arm M79.621 General InformationTherapy Episode of Care  Date of Visit:   08/16/2022  Treatment Number:   4  Start of Care Date:   07/31/2022  Onset of Illness/Injury Date:   4/15/2023Precautions/Limitations   Precautions/Limitations:  No known Research scientist (physical sciences) Utilized?  NoMedication Review:Current Outpatient Medications Medication Sig ? CONTOUR NEXT USE TO TEST BLOOD SUGAR 6 TIMES PER DAY ? ciprofloxacin HCl Take 1 tablet (500 mg total) by mouth 2 (two) times daily for 7 days. ? Contour Next EZ Meter USE AS DIRECTED TO CHECK BLOOD SUGARS(PATIENT DUE FOR NEW MONITOR) ? ergocalciferol TAKE 1 CAPSULE BY MOUTH 1 TIME A WEEK ? Fluvirin 2017-2018 Inject 0.5 mLs into the muscle. ADM 0.5ML IM UTD ? lancets USE AS DIRECTED TO CHECK BLOOD SUGAR 6 TIMES DAILY ? TRUEplus Lancets USE AS DIRECTED ? levonorgestreL Mirena 20 mcg/24 hours (5 yrs) 52 mg intrauterine device Take by intrauterine route for 1825 days. ? metFORMIN Take 1 tablet (1,000 mg total) by mouth 2 (two) times daily with breakfast and dinner. ? olopatadine INSTILL 1 DROP INTO BOTH EYES BID SubjectivePt reports Her RUE pain is mostly in the elbow and it is a 4-5/10.   Marland Kitchen  Johnnette Barrios Provided This VisitCPT Code Interventions Timed Minutes Untimed Minutes Total Minutes Therapeutic Activity (97530) STM to LUE shoulder to alleviate muscle tightness, increase AROM, and decrease pain.IASTM to LUE shoulder with use of [place] concave and convex angles with use emollient for skin protctionPt educated on biomechanical makeup of the RUE shoulder.  BUE use of flex bar, ran, for x 10 reps x 2 sets of pronation/supination and wrist flexion/extension  30  30                  Total Treatment Time: 30 AssessmentPt has increase in RUE elbow pain with unknown reason why as pt was not more active.  If pt does not demonstrate decrease in pao next week she will be referred back to MD.    PlanFrequency:  2x per weekPlan for Next VisitStretches

## 2022-08-19 DIAGNOSIS — M79621 Pain in right upper arm: Secondary | ICD-10-CM

## 2022-08-19 DIAGNOSIS — M7521 Bicipital tendinitis, right shoulder: Secondary | ICD-10-CM

## 2022-08-19 DIAGNOSIS — G8929 Other chronic pain: Secondary | ICD-10-CM

## 2022-08-19 DIAGNOSIS — M545 Low back pain, unspecified: Secondary | ICD-10-CM

## 2022-08-21 ENCOUNTER — Ambulatory Visit: Admit: 2022-08-21 | Payer: PRIVATE HEALTH INSURANCE | Primary: Internal Medicine

## 2022-08-21 DIAGNOSIS — G8929 Other chronic pain: Secondary | ICD-10-CM

## 2022-08-21 DIAGNOSIS — M7521 Bicipital tendinitis, right shoulder: Secondary | ICD-10-CM

## 2022-08-21 DIAGNOSIS — M79621 Pain in right upper arm: Secondary | ICD-10-CM

## 2022-08-21 DIAGNOSIS — M545 Low back pain, unspecified: Secondary | ICD-10-CM

## 2022-08-23 ENCOUNTER — Ambulatory Visit: Admit: 2022-08-23 | Payer: PRIVATE HEALTH INSURANCE | Primary: Internal Medicine

## 2022-08-23 DIAGNOSIS — M545 Low back pain, unspecified: Secondary | ICD-10-CM

## 2022-08-23 DIAGNOSIS — G8929 Other chronic pain: Secondary | ICD-10-CM

## 2022-08-23 DIAGNOSIS — M7521 Bicipital tendinitis, right shoulder: Secondary | ICD-10-CM

## 2022-08-23 DIAGNOSIS — M79621 Pain in right upper arm: Secondary | ICD-10-CM

## 2022-08-28 ENCOUNTER — Inpatient Hospital Stay: Admit: 2022-08-28 | Discharge: 2022-08-28 | Payer: PRIVATE HEALTH INSURANCE | Primary: Internal Medicine

## 2022-08-28 DIAGNOSIS — M7521 Bicipital tendinitis, right shoulder: Secondary | ICD-10-CM

## 2022-08-28 DIAGNOSIS — M79621 Pain in right upper arm: Secondary | ICD-10-CM

## 2022-08-28 DIAGNOSIS — G8929 Other chronic pain: Secondary | ICD-10-CM

## 2022-08-28 DIAGNOSIS — M545 Low back pain, unspecified: Secondary | ICD-10-CM

## 2022-08-28 NOTE — Other
Patient no showed for therapy appointment.  Phoned patient and left voice message regarding our no show policy as well as date and time of next scheduled appointment.

## 2022-08-30 ENCOUNTER — Inpatient Hospital Stay: Admit: 2022-08-30 | Discharge: 2022-08-30 | Payer: PRIVATE HEALTH INSURANCE | Primary: Internal Medicine

## 2022-08-30 DIAGNOSIS — G8929 Other chronic pain: Secondary | ICD-10-CM

## 2022-08-30 DIAGNOSIS — M545 Low back pain, unspecified: Secondary | ICD-10-CM

## 2022-08-30 DIAGNOSIS — M79621 Pain in right upper arm: Secondary | ICD-10-CM

## 2022-08-30 DIAGNOSIS — M7521 Bicipital tendinitis, right shoulder: Secondary | ICD-10-CM

## 2022-08-30 NOTE — Other
Pt discharged from therapy due to no show for consecutive appointments per hospital policy.

## 2022-11-01 ENCOUNTER — Encounter: Admit: 2022-11-01 | Payer: PRIVATE HEALTH INSURANCE | Attending: Internal Medicine | Primary: Internal Medicine

## 2022-11-01 ENCOUNTER — Ambulatory Visit: Admit: 2022-11-01 | Payer: Managed Care (Private) | Attending: Internal Medicine | Primary: Internal Medicine

## 2022-11-01 MED ORDER — OLOPATADINE 0.1 % EYE DROPS
0.1 | Freq: Two times a day (BID) | OPHTHALMIC | 3 refills | 25.00000 days | Status: AC
Start: 2022-11-01 — End: ?

## 2022-11-04 ENCOUNTER — Inpatient Hospital Stay: Admit: 2022-11-04 | Discharge: 2022-11-04 | Payer: PRIVATE HEALTH INSURANCE | Primary: Internal Medicine

## 2022-11-04 DIAGNOSIS — E119 Type 2 diabetes mellitus without complications: Secondary | ICD-10-CM

## 2022-11-04 DIAGNOSIS — E785 Hyperlipidemia, unspecified: Secondary | ICD-10-CM

## 2022-11-04 DIAGNOSIS — Z0001 Encounter for general adult medical examination with abnormal findings: Secondary | ICD-10-CM

## 2022-11-04 DIAGNOSIS — E559 Vitamin D deficiency, unspecified: Secondary | ICD-10-CM

## 2022-11-04 DIAGNOSIS — Z1231 Encounter for screening mammogram for malignant neoplasm of breast: Secondary | ICD-10-CM

## 2022-11-04 DIAGNOSIS — Z1211 Encounter for screening for malignant neoplasm of colon: Secondary | ICD-10-CM

## 2022-11-04 LAB — CBC WITH AUTO DIFFERENTIAL
BKR ALKALINE PHOSPHATASE: 0.3 % (ref 0.0–1.0)
BKR WAM ABSOLUTE IMMATURE GRANULOCYTES.: 0.01 x 1000/??L (ref 0.00–0.30)
BKR WAM ABSOLUTE LYMPHOCYTE COUNT.: 1.65 x 1000/??L (ref 0.60–3.70)
BKR WAM ABSOLUTE NEUTROPHIL COUNT.: 1.29 x 1000/??L — ABNORMAL LOW (ref 2.00–7.60)
BKR WAM BASOPHIL ABSOLUTE COUNT.: 0.03 x 1000/??L (ref 0.00–1.00)
BKR WAM BASOPHILS: 0.9 % (ref 0.0–1.4)
BKR WAM EOSINOPHIL ABSOLUTE COUNT.: 0.12 x 1000/??L (ref 0.00–1.00)
BKR WAM EOSINOPHILS: 3.5 % (ref 0.0–5.0)
BKR WAM HEMATOCRIT (2 DEC): 37.6 % (ref 35.00–45.00)
BKR WAM HEMOGLOBIN: 12.4 g/dL (ref 11.7–15.5)
BKR WAM IMMATURE GRANULOCYTES: 0.3 % (ref 0.0–1.0)
BKR WAM LYMPHOCYTES: 48.7 % (ref 17.0–50.0)
BKR WAM MCH PG: 29.8 pg (ref 27.0–33.0)
BKR WAM MCHC: 33 g/dL (ref 31.0–36.0)
BKR WAM MCV: 90.4 fL (ref 80.0–100.0)
BKR WAM MONOCYTE ABSOLUTE COUNT.: 0.29 x 1000/??L (ref 0.00–1.00)
BKR WAM MONOCYTES: 8.6 % (ref 4.0–12.0)
BKR WAM MPV: 12.2 fL — ABNORMAL HIGH (ref 8.0–12.0)
BKR WAM NEUTROPHILS: 38 % — ABNORMAL LOW (ref 39.0–72.0)
BKR WAM NUCLEATED RED BLOOD CELLS: 0 % (ref 0.0–1.0)
BKR WAM PLATELETS: 178 x1000/??L (ref 150–420)
BKR WAM RDW-CV: 13.2 % (ref 11.0–15.0)
BKR WAM RED BLOOD CELL COUNT.: 4.16 M/??L (ref 4.00–6.00)

## 2022-11-04 LAB — ALBUMIN/CREATININE PANEL, URINE, RANDOM
BKR ALBUMIN, URINE, RANDOM: 5.7 mg/L (ref <30.0)
BKR CREATININE, URINE, RANDOM: 74 mg/dL
BKR MICROALBUMIN/CREATININE RATIO, URINE, RANDOM (LM): 7.7 mg/g{creat} (ref <30.0)
BKR WAM WHITE BLOOD CELL COUNT: 7.7 mg/g Cr — ABNORMAL LOW (ref <30.0)
VITAMIN D, 25-OH D2: 5.7 mg/L (ref <30.0)

## 2022-11-04 LAB — COMPREHENSIVE METABOLIC PANEL
BKR ALANINE AMINOTRANSFERASE (ALT): 24 U/L (ref 13–56)
BKR ALBUMIN: 4 g/dL (ref 3.4–5.0)
BKR ANION GAP (LM): 5 mmol/L (ref 5–15)
BKR ASPARTATE AMINOTRANSFERASE (AST): 9 U/L — ABNORMAL LOW (ref 15–37)
BKR BILIRUBIN TOTAL: 0.4 mg/dL (ref <1.0)
BKR BLOOD UREA NITROGEN: 11 mg/dL (ref 7–18)
BKR CALCIUM: 8.9 mg/dL (ref 8.5–10.1)
BKR CHLORIDE: 105 mmol/L (ref 98–107)
BKR CO2: 27 mmol/L (ref 21–32)
BKR CREATININE: 0.57 mg/dL (ref 0.55–1.02)
BKR EGFR, CREATININE (CKD-EPI 2021): >60 mL/min/1.73m2 (ref >=60–3.70)
BKR GLOBULIN: 3.8 g/dL (ref 2.5–5.0)
BKR GLUCOSE: 137 mg/dL — ABNORMAL HIGH (ref 65–110)
BKR POTASSIUM: 3.9 mmol/L (ref 3.5–5.1)
BKR PROTEIN TOTAL: 7.8 g/dL (ref 6.4–8.2)
BKR SODIUM: 137 mmol/L (ref 136–145)

## 2022-11-04 LAB — URINALYSIS WITH CULTURE REFLEX      (BH LMW YH)
BKR BILIRUBIN, UA: NEGATIVE % (ref 0.0–1.4)
BKR BLOOD, UA: NEGATIVE g/dL (ref 2.5–5.0)
BKR GLUCOSE, UA: NEGATIVE
BKR KETONES, UA: NEGATIVE
BKR LEUKOCYTE ESTERASE, UA: NEGATIVE
BKR NITRITE, UA: NEGATIVE
BKR PH, UA: 5.5 (ref 5.5–7.5)
BKR PROTEIN, UA: NEGATIVE
BKR SPECIFIC GRAVITY, UA: 1.015 (ref 1.005–1.030)
BKR UROBILINOGEN, UA (MG/DL): <2 mg/dL (ref <=2.0)

## 2022-11-04 LAB — TSH W/REFLEX TO FT4     (BH GH LMW Q YH): BKR THYROID STIMULATING HORMONE (LM): 1.08 u[IU]/mL (ref 0.36–3.74)

## 2022-11-04 LAB — LIPID PANEL
BKR CHOLESTEROL: 170 mg/dL
BKR HDL CHOLESTEROL: 83 mg/dL — ABNORMAL HIGH
BKR LDL CHOLESTEROL SAMPSON CALCULATED: 79 mg/dL
BKR TRIGLYCERIDES: 35 mg/dL (ref 0.36–3.74)

## 2022-11-04 LAB — UA REFLEX CULTURE

## 2022-11-04 LAB — HEMOGLOBIN A1C: BKR HEMOGLOBIN A1C: 7.2 % — ABNORMAL HIGH (ref 4.2–6.3)

## 2022-11-07 LAB — QUESTASSURED 25-OH VITAMIN D, (D2,D3), LC/MS/MS (BH GH LMW Q)
VITAMIN D, 25-OH D3: 9 ng/mL
VITAMIN D, 25-OH TOTAL: 28 ng/mL — ABNORMAL LOW (ref 30–100)

## 2022-11-08 NOTE — Other
Left detailed message per HIPAA

## 2023-03-06 ENCOUNTER — Encounter: Admit: 2023-03-06 | Payer: PRIVATE HEALTH INSURANCE | Attending: Internal Medicine | Primary: Internal Medicine

## 2023-03-06 MED ORDER — METFORMIN 1,000 MG TABLET
1000 | ORAL_TABLET | Freq: Two times a day (BID) | ORAL | 4 refills | 90.00000 days | Status: AC
Start: 2023-03-06 — End: 2024-02-26

## 2023-03-12 ENCOUNTER — Encounter: Admit: 2023-03-12 | Payer: Managed Care (Private) | Attending: Internal Medicine | Primary: Internal Medicine

## 2023-04-03 ENCOUNTER — Encounter: Admit: 2023-04-03 | Payer: PRIVATE HEALTH INSURANCE | Attending: Internal Medicine | Primary: Internal Medicine

## 2023-04-03 MED ORDER — ERGOCALCIFEROL (VITAMIN D2) 1,250 MCG (50,000 UNIT) CAPSULE
1250 | ORAL_CAPSULE | ORAL | 4 refills | 84.00000 days | Status: AC
Start: 2023-04-03 — End: ?

## 2023-07-28 ENCOUNTER — Encounter: Admit: 2023-07-28 | Payer: PRIVATE HEALTH INSURANCE | Attending: Internal Medicine | Primary: Internal Medicine

## 2023-07-30 MED ORDER — FREESTYLE LITE METER KIT
1 refills | Status: AC
Start: 2023-07-30 — End: ?

## 2023-07-30 MED ORDER — FREESTYLE LITE STRIPS
4 refills | Status: AC
Start: 2023-07-30 — End: ?

## 2024-01-15 ENCOUNTER — Encounter: Admit: 2024-01-15 | Payer: PRIVATE HEALTH INSURANCE | Attending: Internal Medicine | Primary: Internal Medicine

## 2024-01-15 ENCOUNTER — Encounter: Admit: 2024-01-15 | Payer: Managed Care (Private) | Attending: Internal Medicine | Primary: Internal Medicine

## 2024-01-15 DIAGNOSIS — E559 Vitamin D deficiency, unspecified: Secondary | ICD-10-CM

## 2024-01-15 DIAGNOSIS — M542 Cervicalgia: Secondary | ICD-10-CM

## 2024-01-15 DIAGNOSIS — Z1389 Encounter for screening for other disorder: Secondary | ICD-10-CM

## 2024-01-15 DIAGNOSIS — E119 Type 2 diabetes mellitus without complications: Secondary | ICD-10-CM

## 2024-01-15 DIAGNOSIS — M25562 Pain in left knee: Secondary | ICD-10-CM

## 2024-01-15 DIAGNOSIS — M545 Chronic bilateral low back pain, unspecified whether sciatica present: Secondary | ICD-10-CM

## 2024-01-15 DIAGNOSIS — J302 Other seasonal allergic rhinitis: Secondary | ICD-10-CM

## 2024-01-15 MED ORDER — NABUMETONE 500 MG TABLET
500 | ORAL_TABLET | Freq: Two times a day (BID) | ORAL | 1 refills | 30.00000 days | Status: AC | PRN
Start: 2024-01-15 — End: 2024-08-04

## 2024-01-15 MED ORDER — CYCLOBENZAPRINE 5 MG TABLET
5 | ORAL_TABLET | Freq: Two times a day (BID) | ORAL | 1 refills | 15.00000 days | Status: AC | PRN
Start: 2024-01-15 — End: 2024-08-04

## 2024-01-16 ENCOUNTER — Encounter: Admit: 2024-01-16 | Payer: PRIVATE HEALTH INSURANCE | Primary: Internal Medicine

## 2024-01-16 ENCOUNTER — Inpatient Hospital Stay: Admit: 2024-01-16 | Discharge: 2024-01-16 | Payer: PRIVATE HEALTH INSURANCE | Primary: Internal Medicine

## 2024-01-16 DIAGNOSIS — E119 Type 2 diabetes mellitus without complications: Secondary | ICD-10-CM

## 2024-01-16 DIAGNOSIS — M542 Cervicalgia: Secondary | ICD-10-CM

## 2024-01-16 DIAGNOSIS — M25562 Pain in left knee: Secondary | ICD-10-CM

## 2024-01-16 DIAGNOSIS — M545 Low back pain, unspecified: Secondary | ICD-10-CM

## 2024-01-16 DIAGNOSIS — J302 Other seasonal allergic rhinitis: Secondary | ICD-10-CM

## 2024-01-16 DIAGNOSIS — G8929 Other chronic pain: Secondary | ICD-10-CM
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
## 2024-01-23 ENCOUNTER — Inpatient Hospital Stay: Admit: 2024-01-23 | Discharge: 2024-01-23 | Payer: PRIVATE HEALTH INSURANCE | Primary: Internal Medicine

## 2024-01-23 DIAGNOSIS — M542 Cervicalgia: Secondary | ICD-10-CM

## 2024-01-23 DIAGNOSIS — M25562 Pain in left knee: Secondary | ICD-10-CM

## 2024-01-23 DIAGNOSIS — M5451 Vertebrogenic low back pain: Secondary | ICD-10-CM

## 2024-01-23 NOTE — Other
 [facility] [place] [address]ZDGUY Number: [phone] Number: [phone] Therapy Cancel Note3/5/2025Patient Name:  Tina TawfeegMedical Record Number:  [id number] Date of Birth:  10-23-1972Therapist:  Fernanda Drum Stapel cancelled today's appointment. [place] be out of the country so she will reschedule when she gets back to the [place].

## 2024-01-28 ENCOUNTER — Ambulatory Visit: Admit: 2024-01-28 | Payer: PRIVATE HEALTH INSURANCE | Primary: Internal Medicine

## 2024-01-30 ENCOUNTER — Ambulatory Visit: Admit: 2024-01-30 | Payer: PRIVATE HEALTH INSURANCE | Primary: Internal Medicine

## 2024-02-05 ENCOUNTER — Encounter: Admit: 2024-02-05 | Payer: PRIVATE HEALTH INSURANCE | Attending: Internal Medicine | Primary: Internal Medicine

## 2024-02-06 ENCOUNTER — Encounter: Admit: 2024-02-06 | Payer: PRIVATE HEALTH INSURANCE | Attending: Internal Medicine | Primary: Internal Medicine

## 2024-02-06 ENCOUNTER — Encounter: Admit: 2024-02-06 | Payer: Managed Care (Private) | Attending: Internal Medicine | Primary: Internal Medicine

## 2024-02-11 ENCOUNTER — Ambulatory Visit: Admit: 2024-02-11 | Payer: PRIVATE HEALTH INSURANCE | Primary: Internal Medicine

## 2024-02-12 ENCOUNTER — Encounter: Admit: 2024-02-12 | Payer: Managed Care (Private) | Attending: Internal Medicine | Primary: Internal Medicine

## 2024-02-13 ENCOUNTER — Ambulatory Visit: Admit: 2024-02-13 | Payer: PRIVATE HEALTH INSURANCE | Primary: Internal Medicine

## 2024-02-18 ENCOUNTER — Ambulatory Visit: Admit: 2024-02-18 | Payer: PRIVATE HEALTH INSURANCE | Primary: Internal Medicine

## 2024-02-18 DIAGNOSIS — M25562 Pain in left knee: Secondary | ICD-10-CM

## 2024-02-18 DIAGNOSIS — G8929 Other chronic pain: Secondary | ICD-10-CM

## 2024-02-18 DIAGNOSIS — M545 Low back pain, unspecified: Secondary | ICD-10-CM

## 2024-02-18 DIAGNOSIS — M542 Cervicalgia: Secondary | ICD-10-CM

## 2024-02-20 ENCOUNTER — Ambulatory Visit: Admit: 2024-02-20 | Payer: PRIVATE HEALTH INSURANCE | Primary: Internal Medicine

## 2024-02-26 ENCOUNTER — Encounter: Admit: 2024-02-26 | Payer: PRIVATE HEALTH INSURANCE | Attending: Internal Medicine | Primary: Internal Medicine

## 2024-02-26 MED ORDER — METFORMIN 1,000 MG TABLET
1000 | ORAL_TABLET | Freq: Two times a day (BID) | ORAL | 4 refills | 90.00000 days | Status: AC
Start: 2024-02-26 — End: ?

## 2024-03-24 MED ORDER — ERGOCALCIFEROL (VITAMIN D2) 1,250 MCG (50,000 UNIT) CAPSULE
1250 | ORAL_CAPSULE | ORAL | 4 refills | 84.00000 days | Status: AC
Start: 2024-03-24 — End: ?

## 2024-06-27 ENCOUNTER — Ambulatory Visit: Admit: 2024-06-27 | Payer: Managed Care (Private) | Attending: Internal Medicine | Primary: Internal Medicine

## 2024-06-27 ENCOUNTER — Inpatient Hospital Stay: Admit: 2024-06-27 | Discharge: 2024-06-27 | Payer: Managed Care (Private) | Primary: Internal Medicine

## 2024-06-27 ENCOUNTER — Encounter: Admit: 2024-06-27 | Payer: PRIVATE HEALTH INSURANCE | Attending: Internal Medicine | Primary: Internal Medicine

## 2024-06-27 DIAGNOSIS — M542 Cervicalgia: Secondary | ICD-10-CM

## 2024-06-27 DIAGNOSIS — M545 Chronic bilateral low back pain, unspecified whether sciatica present: Secondary | ICD-10-CM

## 2024-06-27 DIAGNOSIS — Z1389 Encounter for screening for other disorder: Secondary | ICD-10-CM

## 2024-06-27 DIAGNOSIS — R923 Dense breast tissue on mammogram, unspecified type: Secondary | ICD-10-CM

## 2024-06-27 DIAGNOSIS — Z130 Encounter for screening for diseases of the blood and blood-forming organs and certain disorders involving the immune mechanism: Secondary | ICD-10-CM

## 2024-06-27 DIAGNOSIS — M2550 Pain in unspecified joint: Secondary | ICD-10-CM

## 2024-06-27 DIAGNOSIS — M25562 Pain in left knee: Secondary | ICD-10-CM

## 2024-06-27 DIAGNOSIS — Z1231 Encounter for screening mammogram for malignant neoplasm of breast: Secondary | ICD-10-CM

## 2024-06-27 DIAGNOSIS — G8929 Other chronic pain: Secondary | ICD-10-CM

## 2024-06-27 DIAGNOSIS — E119 Type 2 diabetes mellitus without complications: Secondary | ICD-10-CM

## 2024-06-27 DIAGNOSIS — Z1211 Encounter for screening for malignant neoplasm of colon: Secondary | ICD-10-CM

## 2024-06-27 DIAGNOSIS — R5383 Other fatigue: Secondary | ICD-10-CM

## 2024-06-27 DIAGNOSIS — Z1212 Encounter for screening for malignant neoplasm of rectum: Secondary | ICD-10-CM

## 2024-06-27 DIAGNOSIS — Z0001 Encounter for general adult medical examination with abnormal findings: Secondary | ICD-10-CM

## 2024-06-27 DIAGNOSIS — E559 Vitamin D deficiency, unspecified: Secondary | ICD-10-CM

## 2024-06-27 DIAGNOSIS — Z124 Encounter for screening for malignant neoplasm of cervix: Secondary | ICD-10-CM

## 2024-06-27 LAB — CBC WITH AUTO DIFFERENTIAL
BKR WAM ABSOLUTE IMMATURE GRANULOCYTES.: 0 x 1000/ÂµL (ref 0.00–0.30)
BKR WAM ABSOLUTE LYMPHOCYTE COUNT.: 1.57 x 1000/ÂµL (ref 0.60–3.70)
BKR WAM ABSOLUTE NRBC: 0 x 1000/ÂµL (ref 0.00–1.00)
BKR WAM ANC (ABSOLUTE NEUTROPHIL COUNT): 0.85 x 1000/ÂµL — ABNORMAL LOW (ref 2.00–7.60)
BKR WAM BASOPHIL ABSOLUTE COUNT.: 0.01 x 1000/ÂµL (ref 0.00–1.00)
BKR WAM BASOPHILS: 0.4 % (ref 0.0–1.4)
BKR WAM EOSINOPHIL ABSOLUTE COUNT.: 0.15 x 1000/ÂµL (ref 0.00–1.00)
BKR WAM EOSINOPHILS: 5.3 % — ABNORMAL HIGH (ref 0.0–5.0)
BKR WAM HEMATOCRIT: 34.8 % — ABNORMAL LOW (ref 35.00–45.00)
BKR WAM HEMOGLOBIN: 11.3 g/dL — ABNORMAL LOW (ref 11.7–15.5)
BKR WAM IMMATURE GRANULOCYTES: 0 % (ref 0.0–1.0)
BKR WAM LYMPHOCYTES: 55.5 % — ABNORMAL HIGH (ref 17.0–50.0)
BKR WAM MCH: 29.5 pg (ref 27.0–33.0)
BKR WAM MCHC: 32.5 g/dL (ref 31.0–36.0)
BKR WAM MCV: 90.9 fL (ref 80.0–100.0)
BKR WAM MONOCYTE ABSOLUTE COUNT.: 0.25 x 1000/ÂµL (ref 0.00–1.00)
BKR WAM MONOCYTES: 8.8 % (ref 4.0–12.0)
BKR WAM MPV: 11.8 fL (ref 8.0–12.0)
BKR WAM NEUTROPHILS: 30 % — ABNORMAL LOW (ref 39.0–72.0)
BKR WAM NUCLEATED RED BLOOD CELLS: 0 % (ref 0.0–1.0)
BKR WAM PLATELETS: 225 x1000/ÂµL (ref 150–420)
BKR WAM RDW-CV: 13 % (ref 11.0–15.0)
BKR WAM RED BLOOD CELL COUNT.: 3.83 M/ÂµL — ABNORMAL LOW (ref 4.00–6.00)
BKR WAM WHITE BLOOD CELL COUNT: 2.8 x1000/ÂµL — ABNORMAL LOW (ref 4.0–11.0)

## 2024-06-27 LAB — URINALYSIS WITH CULTURE REFLEX      (BH LMW YH)
BKR BILIRUBIN, UA: NEGATIVE
BKR BLOOD, UA: NEGATIVE
BKR GLUCOSE, UA: NEGATIVE
BKR KETONES, UA: NEGATIVE
BKR LEUKOCYTE ESTERASE, UA: NEGATIVE
BKR NITRITE, UA: NEGATIVE
BKR PH, UA: 7.5 (ref 5.5–7.5)
BKR PROTEIN, UA: NEGATIVE
BKR SPECIFIC GRAVITY, UA: 1.012 (ref 1.005–1.030)
BKR UROBILINOGEN, UA: <2 mg/dL (ref <=2.0)

## 2024-06-27 LAB — FOLATE: BKR FOLATE: 34.6 ng/mL (ref >5.4)

## 2024-06-27 LAB — VITAMIN D, 25-HYDROXY: BKR VITAMIN D 25-HYDROXY TOTAL: 53 ng/mL

## 2024-06-27 LAB — IRON AND TIBC
BKR IRON: 81 ug/dL (ref 50–170)
BKR TOTAL IRON BINDING CAPACITY (SCCCT  BH GH LM): 278 ug/dL (ref 250–450)
BKR TRANSFERRIN SATURATION: 29 % (ref 27–40)

## 2024-06-27 LAB — ALBUMIN/CREATININE PANEL, URINE, RANDOM
BKR ALBUMIN, URINE, RANDOM: <5 mg/L (ref <30.0)
BKR CREATININE, URINE, RANDOM: 52 mg/dL

## 2024-06-27 LAB — VITAMIN B12: BKR VITAMIN B12: 719 pg/mL (ref 211–911)

## 2024-06-27 LAB — FERRITIN: BKR FERRITIN: 38 ng/mL (ref 10–291)

## 2024-06-27 LAB — TSH W/REFLEX TO FT4     (BH GH LMW Q YH): BKR THYROID STIMULATING HORMONE (LM): 1.46 u[IU]/mL (ref 0.36–3.74)

## 2024-06-27 LAB — UA REFLEX CULTURE

## 2024-06-28 LAB — LYME ANTIBODIES W/RFLX TO CONFIRM (MODIFIED TWO-TIER TESTING)
BKR LYME TOTAL ANTIBODY INTERPRETATION: UNDETERMINED — AB
BKR LYME TOTAL ANTIBODY LI: 0.95 LI — ABNORMAL HIGH (ref <=0.90)

## 2024-06-30 LAB — ANA IFA SCREEN W/REFL TO TITER AND PATTERN, IFA: BKR ANTI-NUCLEAR ANTIBODY (ANA): POSITIVE — AB

## 2024-06-30 LAB — LYME ANTIBODIES, IGG/IGM CONFIRMATION (YH) (LAB ORDER ONLY)
BKR LYME IGG ANTIBODY: NEGATIVE
BKR LYME IGM ANTIBODY: NEGATIVE

## 2024-07-01 LAB — ANA TITER AND PATTERN     (YH): BKR ANA TITER: 1:320 {titer} — AB

## 2024-08-01 ENCOUNTER — Encounter: Admit: 2024-08-01 | Payer: PRIVATE HEALTH INSURANCE | Primary: Internal Medicine

## 2024-08-01 NOTE — Telephone Encounter
 Nurse Triage NoteNahla Summers reports ongoing bilateral knee pain and swelling x 2 weeks. Patient endorses worsening pain, states it is painful for her to walk. She states she has been referred to rhematology but was not scheduled until next year.She is requesting referral be changed to urgent status so she may have sooner appointment. She is requesting appointment in office in meantime as pain continues. Patient denies additional injury to knees.Appointment scheduled with PCP. Message routed to office for referral update.  DispositionPatient triaged using Schmitt-Thompson, per protocol patient advised to be seen within 3 days, appointment scheduledCare advice provided to the patient. Advised the patient to call back with any new or worsening symptoms. Patient verbalizes understanding.Escalation required: NoReason for Disposition Patient wants to be seenProtocols used: Knee Pain-Adult-[place]

## 2024-08-04 MED ORDER — NABUMETONE 500 MG TABLET
500 | ORAL_TABLET | Freq: Two times a day (BID) | ORAL | 1 refills | 30.00000 days | Status: AC | PRN
Start: 2024-08-04 — End: ?

## 2024-08-04 MED ORDER — MULTIVITAMIN-CA-IRON-MINERALS TABLET
Freq: Every day | ORAL | 1.00 refills | 90.00000 days | Status: AC
Start: 2024-08-04 — End: ?

## 2024-08-09 ENCOUNTER — Ambulatory Visit: Admit: 2024-08-09 | Payer: PRIVATE HEALTH INSURANCE | Primary: Internal Medicine

## 2024-08-13 ENCOUNTER — Encounter: Admit: 2024-08-13 | Payer: PRIVATE HEALTH INSURANCE | Attending: Internal Medicine | Primary: Internal Medicine

## 2024-08-13 DIAGNOSIS — E119 Type 2 diabetes mellitus without complications: Principal | ICD-10-CM

## 2024-08-13 MED ORDER — FREESTYLE LITE STRIPS
INTRAMUSCULAR | 4 refills | Status: AC
Start: 2024-08-13 — End: ?

## 2024-09-24 ENCOUNTER — Encounter: Admit: 2024-09-24 | Payer: PRIVATE HEALTH INSURANCE | Attending: Internal Medicine | Primary: Internal Medicine

## 2024-12-25 ENCOUNTER — Ambulatory Visit: Admit: 2024-12-25 | Payer: Managed Care (Private) | Attending: Family | Primary: Internal Medicine

## 2024-12-25 ENCOUNTER — Encounter: Admit: 2024-12-25 | Payer: PRIVATE HEALTH INSURANCE | Primary: Internal Medicine

## 2024-12-25 DIAGNOSIS — R29898 Other symptoms and signs involving the musculoskeletal system: Principal | ICD-10-CM

## 2024-12-25 DIAGNOSIS — R079 Chest pain, unspecified: Secondary | ICD-10-CM

## 2024-12-25 NOTE — Progress Notes [1]
 Nurse Triage Tessie Peer called in reporting with dizziness, chest pain, and left-sided numbness.Dizziness began two to three days ago and has persisted, affecting daily activities and causing concern about returning to work. Mild chest pain is present. Patient also reports left-sided numbness involves the neck, hand, and arm.She also mentioned occasional trouble breathing. Per protocol; call EMS 911 now. Patient verbalized understanding but ended phone call abruptly. RN attempted to call patient back x2 but was unable to get to reach her. DispositionPatient triaged using Schmitt-Thompson, per protocol patient advised of the need for emergent evaluation, referred to the [facility] advice provided to the patient. Advised the patient to call back with any new or worsening symptoms. Patient verbalizes understanding.Escalation required: No

## 2024-12-25 NOTE — Progress Notes [1]
 Tina Summers is a 54 y.o.female come to the office with complaint of numbness and tingling with weakness to the back of her head down the left arm.  She thinks might be related to looking at a computer screen too long. For 3 days now she has felt a sensation in her head like she is going to fall asleep/shut down/jolt for just a few seconds then she wakes up. Denies any headaches. She reports chest pain 2 days ago. Denies any chest pain currently. She was told by triage nurse to call 911 and go to there ER, but she presents to the walk in instead. Problem list:Patient Active Problem List  Diagnosis Date Noted  Cervicalgia 01/16/2024  Vertebrogenic low back pain 01/16/2024  Acute pain of left knee 01/16/2024  Dysuria 08/12/2022  Pain of right upper arm 07/31/2022  Counseling on health promotion and disease prevention 02/02/2018  Vitamin D  deficiency 11/07/2016  Chronic midline low back pain without sciatica 08/13/2015  Type 2 diabetes mellitus, without long-term current use of insulin (HC CODE) 08/13/2015 Past Medical History[1]Past Surgical History[2]Medications:Current Medications[3]Allergies:She has no known allergies.Review of Systems Constitutional: Negative for chills, decreased appetite, fever and malaise/fatigue. HENT:  Negative for congestion, ear discharge, ear pain and sore throat.  Eyes:  Negative for blurred vision. Cardiovascular:  Negative for chest pain. Respiratory:  Negative for cough, shortness of breath, sputum production and wheezing.  Skin:  Negative for itching and rash. Musculoskeletal:  Negative for myalgias. Gastrointestinal:  Negative for abdominal pain. Neurological:  Positive for focal weakness and numbness. Negative for dizziness and headaches.      Numbness/weakness of left arm/neckIntermittent fall asleep/shut down then awakes for 1-2 seconds episodes  Vital Signs: Vitals:  12/25/24 1007 BP: (!) 144/90 Site: Left Arm Position: Sitting Pulse: 78 Temp: 97.3 ?F (36.3 ?C) TempSrc: No-touch scanner SpO2: 100% Weight: 81.6 kg Height: 5' 5 (1.651 m) Physical ExamConstitutional:     Appearance: Normal appearance. HENT:    Head: Normocephalic and atraumatic. Cardiovascular:    Rate and Rhythm: Normal rate and regular rhythm. Pulmonary:    Effort: Pulmonary effort is normal. No respiratory distress.    Breath sounds: No stridor. No wheezing, rhonchi or rales. Chest:    Chest wall: No tenderness. Musculoskeletal:       General: Normal range of motion.    Cervical back: Normal range of motion and neck supple. Skin:   General: Skin is warm. Neurological:    General: No focal deficit present.    Mental Status: She is alert and oriented to person, place, and time.    Motor: No weakness.    Coordination: Coordination normal.    Gait: Gait normal. Psychiatric:       Mood and Affect: Mood normal.  Assessment and Plan:Assessment & Plan                                                                                                           ICD-10-CM  1. Weakness of left arm  R29.898   2. Chest pain, unspecified type  R07.9  Discussed with patient the need for further evaluation in ER and ambulance transport. Pt has agreed. EMS arrival to walk in, pt refused ambulance and ER and signed AMA form. Follow-Up Return if symptoms worsen or fail to improve.   I encouraged the patient to call me if they have any questions and more importantly any new symptoms.Burnard Seat, NP-[facility] [phone] [place], [place]. [phone] [1] Past Medical History:Diagnosis Date  Diabetes mellitus (HC Code)    Seasonal allergies  [2] No past surgical history on file.[3] Current Outpatient Medications Medication Sig Dispense Refill  blood sugar diagnostic (FREESTYLE LITE) test strips USE TO TEST BLOOD SUGAR LEVELS UP TO 6 TIMES DAILY 500 each 3  blood-glucose meter (FREESTYLE LITE METER) device as directed 1 each 0  CONTOUR NEXT EZ METER device USE AS DIRECTED TO CHECK BLOOD SUGARS(PATIENT DUE FOR NEW MONITOR) 1 each 11  ergocalciferol  (DRISDOL ) 1,250 mcg (50,000 unit) capsule TAKE 1 CAPSULE BY MOUTH 1 TIME A WEEK 12 capsule 3  lancets (MICROLET) USE AS DIRECTED TO CHECK BLOOD SUGAR 6 TIMES DAILY 100 each 0  lancets (TRUEPLUS LANCETS) 30 gauge Misc USE AS DIRECTED 100 each 12  metFORMIN  (GLUCOPHAGE ) 1000 mg tablet TAKE 1 TABLET(1000 MG) BY MOUTH TWICE DAILY WITH BREAKFAST AND DINNER 180 tablet 3  multivitamin-Ca-iron -minerals tablet Take 1 tablet by mouth daily.    nabumetone  (RELAFEN ) 500 mg tablet Take 1 tablet (500 mg total) by mouth 2 (two) times daily as needed for pain. 60 tablet 0  olopatadine  (PATANOL) 0.1 % ophthalmic solution Place 1 drop into both eyes 2 (two) times daily. 5 mL 2 No current facility-administered medications for this visit.

## 2024-12-25 NOTE — Telephone Encounter [36]
 Reason for Disposition? New neurologic deficit that is present now: * Weakness of the face, arm, or leg on one side of the body * Numbness of the face, arm, or leg on one side of the body * Loss of speech or garbled speechProtocols used: Dizziness-Adult-OH

## 2025-01-08 ENCOUNTER — Encounter: Admit: 2025-01-08 | Payer: PRIVATE HEALTH INSURANCE | Attending: Internal Medicine | Primary: Internal Medicine

## 2025-02-19 ENCOUNTER — Ambulatory Visit: Admit: 2025-02-19 | Payer: PRIVATE HEALTH INSURANCE | Attending: Rheumatology | Primary: Internal Medicine
